# Patient Record
Sex: Male | Born: 1960 | Race: White | Hispanic: No | Marital: Married | Smoking: Never smoker
Health system: Southern US, Community
[De-identification: ages and names within clinical notes are randomized; demographics above are authoritative.]

## PROBLEM LIST (undated history)

## (undated) DIAGNOSIS — Z87442 Personal history of urinary calculi: Secondary | ICD-10-CM

## (undated) DIAGNOSIS — H3520 Other non-diabetic proliferative retinopathy, unspecified eye: Secondary | ICD-10-CM

## (undated) DIAGNOSIS — I739 Peripheral vascular disease, unspecified: Secondary | ICD-10-CM

## (undated) DIAGNOSIS — N529 Male erectile dysfunction, unspecified: Secondary | ICD-10-CM

## (undated) DIAGNOSIS — C61 Malignant neoplasm of prostate: Secondary | ICD-10-CM

## (undated) DIAGNOSIS — I639 Cerebral infarction, unspecified: Secondary | ICD-10-CM

## (undated) DIAGNOSIS — G4730 Sleep apnea, unspecified: Secondary | ICD-10-CM

## (undated) DIAGNOSIS — I10 Essential (primary) hypertension: Secondary | ICD-10-CM

## (undated) DIAGNOSIS — I219 Acute myocardial infarction, unspecified: Secondary | ICD-10-CM

## (undated) DIAGNOSIS — Z86010 Personal history of colonic polyps: Secondary | ICD-10-CM

## (undated) DIAGNOSIS — L97509 Non-pressure chronic ulcer of other part of unspecified foot with unspecified severity: Secondary | ICD-10-CM

## (undated) DIAGNOSIS — E119 Type 2 diabetes mellitus without complications: Secondary | ICD-10-CM

## (undated) DIAGNOSIS — J189 Pneumonia, unspecified organism: Secondary | ICD-10-CM

## (undated) DIAGNOSIS — E785 Hyperlipidemia, unspecified: Secondary | ICD-10-CM

## (undated) DIAGNOSIS — I2510 Atherosclerotic heart disease of native coronary artery without angina pectoris: Secondary | ICD-10-CM

## (undated) DIAGNOSIS — H409 Unspecified glaucoma: Secondary | ICD-10-CM

## (undated) HISTORY — DX: Hyperlipidemia, unspecified: E78.5

## (undated) HISTORY — DX: Cerebral infarction, unspecified: I63.9

## (undated) HISTORY — PX: REFRACTIVE SURGERY: SHX103

## (undated) HISTORY — DX: Unspecified glaucoma: H40.9

## (undated) HISTORY — DX: Essential (primary) hypertension: I10

## (undated) HISTORY — PX: BRONCHOSCOPY: SUR163

## (undated) HISTORY — PX: EYE SURGERY: SHX253

## (undated) HISTORY — PX: FEMORAL-POPLITEAL BYPASS GRAFT: SHX937

## (undated) HISTORY — DX: Sleep apnea, unspecified: G47.30

## (undated) HISTORY — DX: Type 2 diabetes mellitus without complications: E11.9

---

## 2006-06-17 ENCOUNTER — Emergency Department: Payer: Self-pay | Admitting: Emergency Medicine

## 2006-06-19 ENCOUNTER — Inpatient Hospital Stay: Payer: Self-pay | Admitting: Internal Medicine

## 2006-06-22 ENCOUNTER — Other Ambulatory Visit: Payer: Self-pay

## 2007-11-02 IMAGING — US US RENAL KIDNEY
1 series · 17 of 20 positions shown · non-contrast
Comparison: none

REASON FOR EXAM: kidney stones
COMMENTS:

[Series 1: us renal kidney · 17 of 20 slices shown]
[im 1/20]
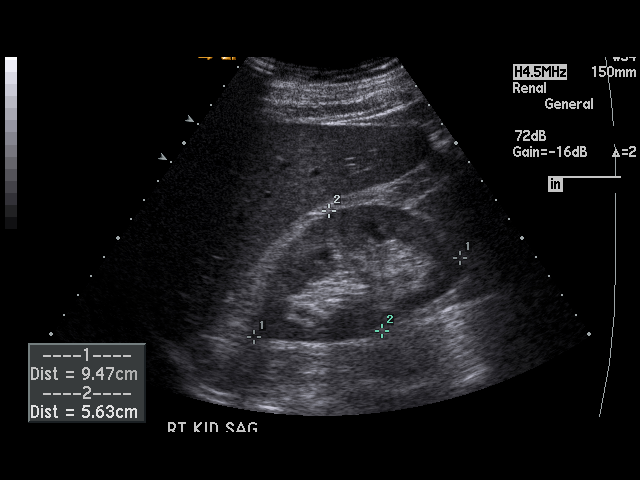
[im 2/20]
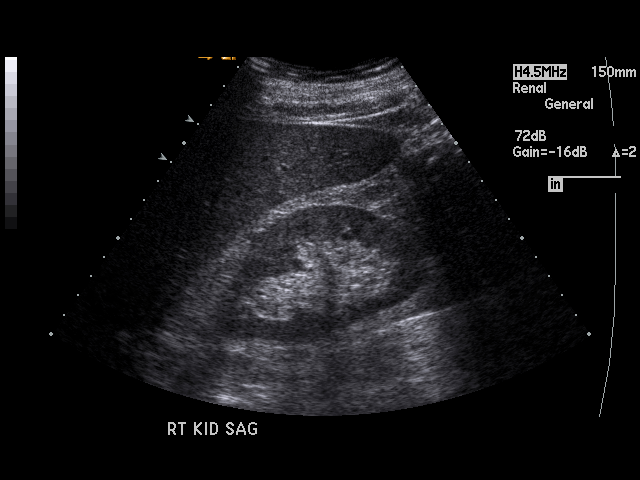
[im 3/20]
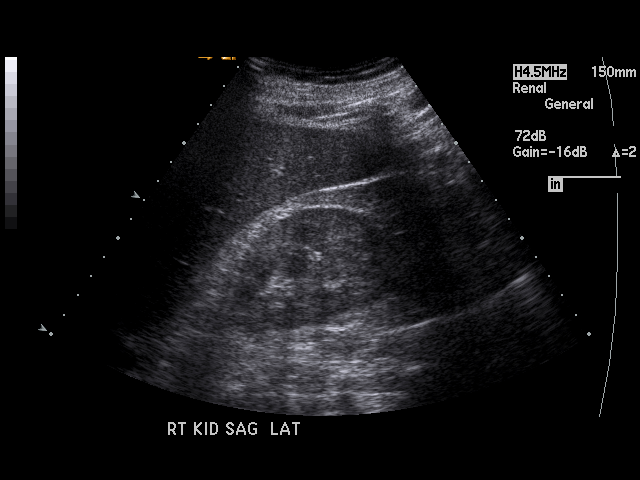
[im 5/20]
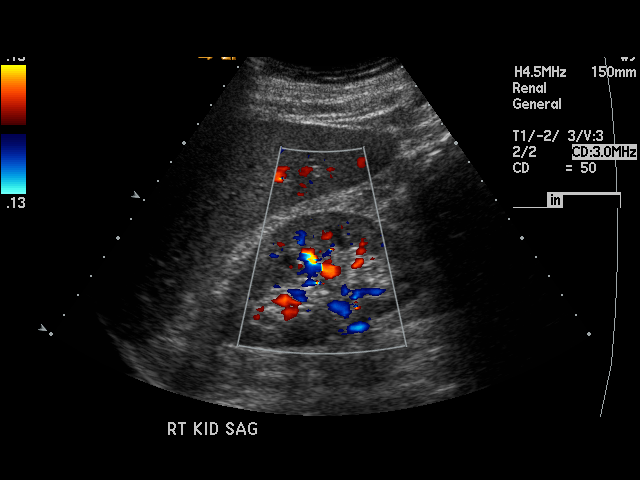
[im 6/20]
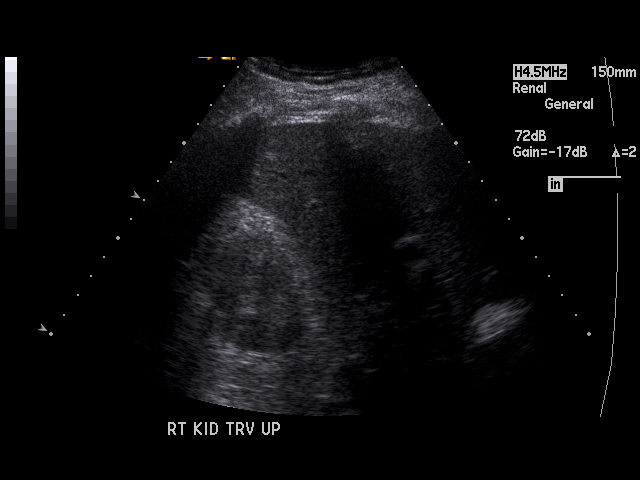
[im 7/20]
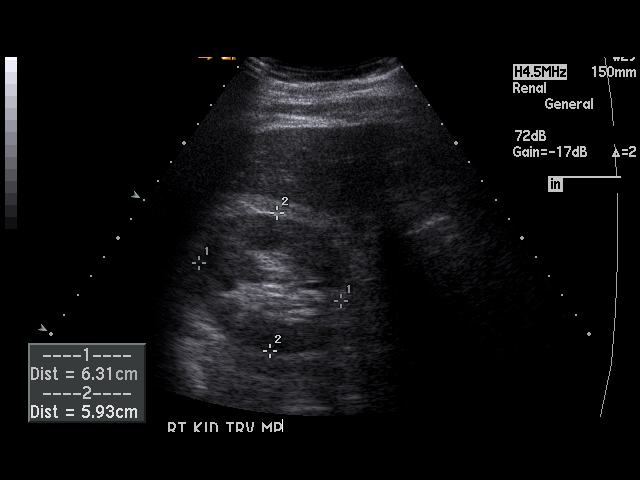
[im 8/20]
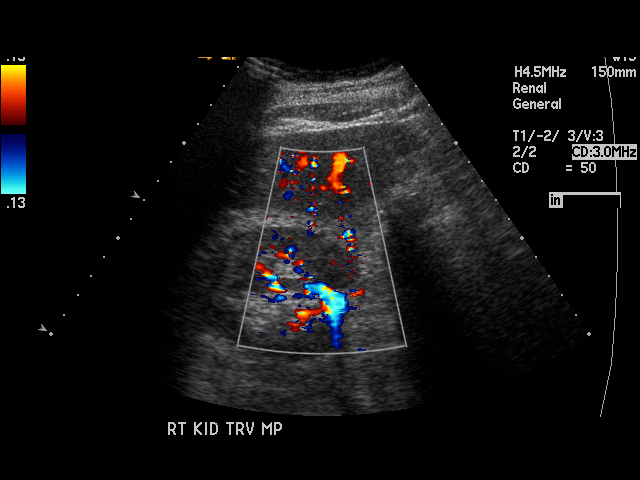
[im 9/20]
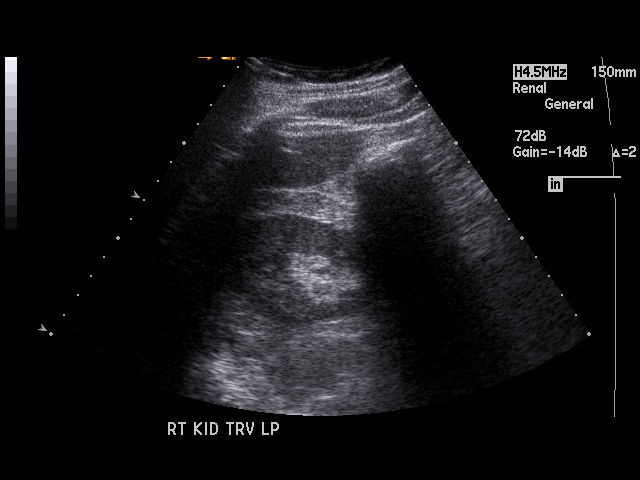
[im 11/20]
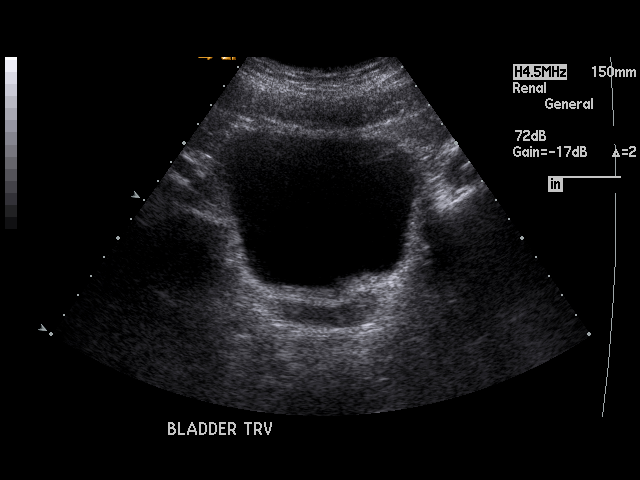
[im 12/20]
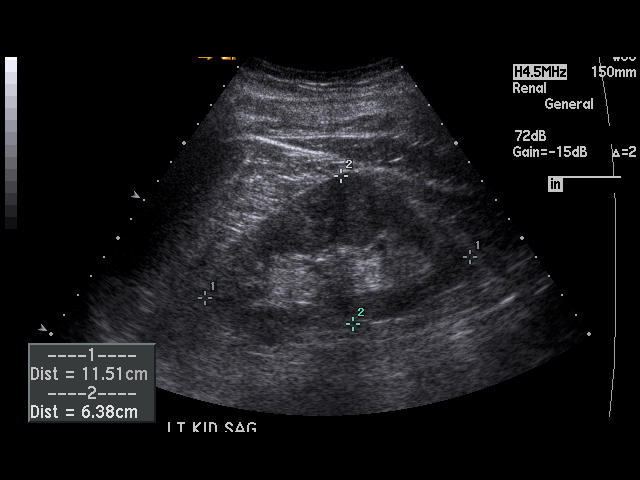
[im 13/20]
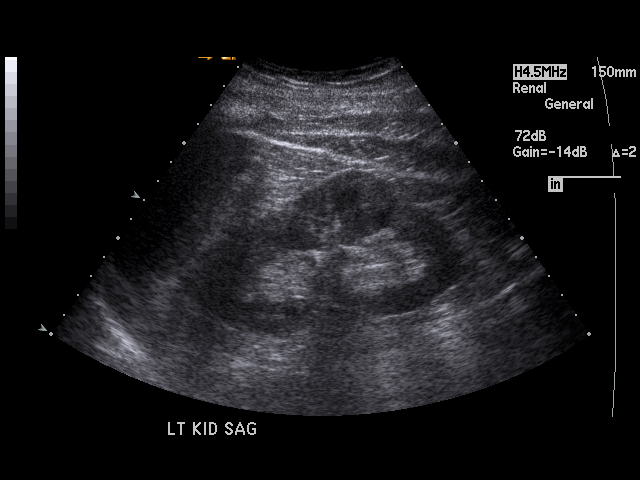
[im 14/20]
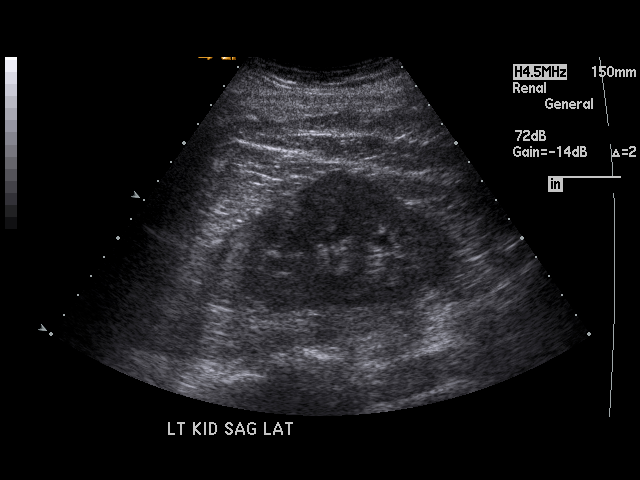
[im 15/20]
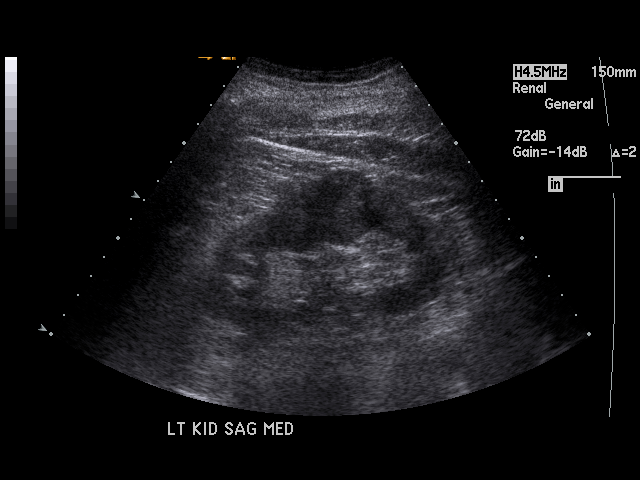
[im 16/20]
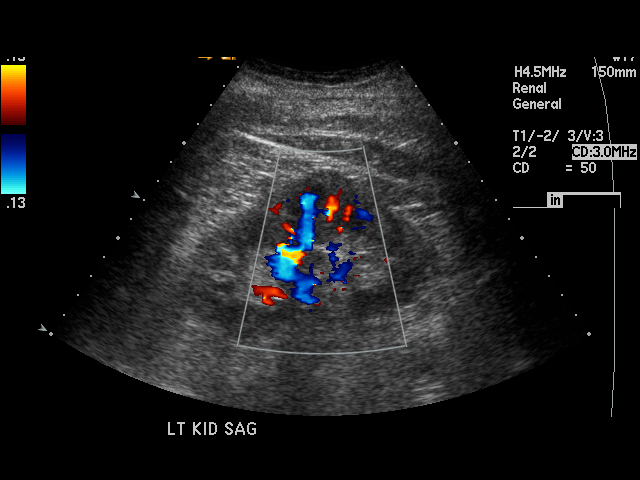
[im 18/20]
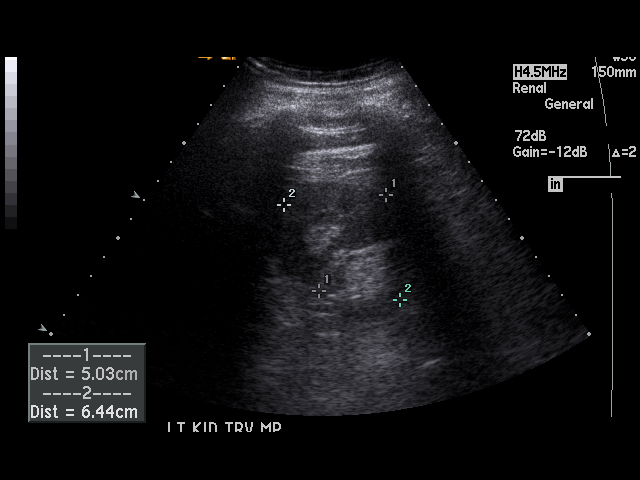
[im 19/20]
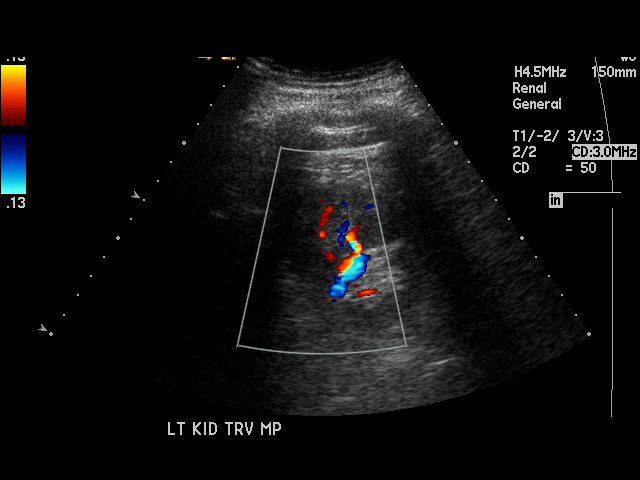
[im 20/20]
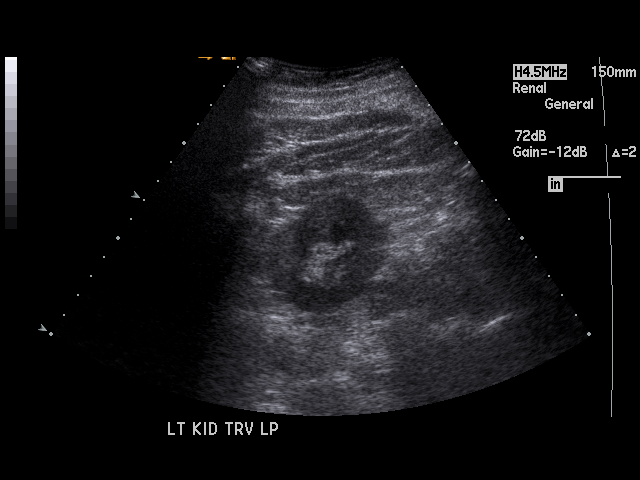

[17 of 20 positions shown; findings below may reference images not displayed]

PROCEDURE:     US  - US KIDNEY BILATERAL  - June 23, 2006  [DATE]

RESULT:     Size, shape and echotexture of both kidneys is normal. The RIGHT
kidney measures 9.5 cm in maximum length and the LEFT kidney measures 11.5.
Renal echotexture is normal. There is no hydronephrosis.  Slight discrepancy
in the renal size is most likely secondary to slight malrotation. A small
dromedary hump is noted in the LEFT kidney.
IMPRESSION: 1)Normal exam.

## 2010-11-13 ENCOUNTER — Encounter: Payer: Self-pay | Admitting: Physical Medicine and Rehabilitation

## 2010-11-27 ENCOUNTER — Encounter: Payer: Self-pay | Admitting: Physical Medicine and Rehabilitation

## 2010-12-28 ENCOUNTER — Encounter: Payer: Self-pay | Admitting: Physical Medicine and Rehabilitation

## 2011-01-27 ENCOUNTER — Encounter: Payer: Self-pay | Admitting: Physical Medicine and Rehabilitation

## 2011-02-27 ENCOUNTER — Encounter: Payer: Self-pay | Admitting: Physical Medicine and Rehabilitation

## 2011-03-30 ENCOUNTER — Encounter: Payer: Self-pay | Admitting: Physical Medicine and Rehabilitation

## 2011-04-29 ENCOUNTER — Encounter: Payer: Self-pay | Admitting: Physical Medicine and Rehabilitation

## 2011-05-30 ENCOUNTER — Encounter: Payer: Self-pay | Admitting: Physical Medicine and Rehabilitation

## 2011-06-29 ENCOUNTER — Encounter: Payer: Self-pay | Admitting: Physical Medicine and Rehabilitation

## 2011-07-30 ENCOUNTER — Encounter: Payer: Self-pay | Admitting: Physical Medicine and Rehabilitation

## 2011-08-30 ENCOUNTER — Encounter: Payer: Self-pay | Admitting: Physical Medicine and Rehabilitation

## 2011-09-27 ENCOUNTER — Encounter: Payer: Self-pay | Admitting: Physical Medicine and Rehabilitation

## 2011-10-28 ENCOUNTER — Encounter: Payer: Self-pay | Admitting: Physical Medicine and Rehabilitation

## 2011-11-14 ENCOUNTER — Ambulatory Visit: Payer: Self-pay | Admitting: Vascular Surgery

## 2011-11-14 LAB — BASIC METABOLIC PANEL
Anion Gap: 8 (ref 7–16)
Calcium, Total: 9.2 mg/dL (ref 8.5–10.1)
Creatinine: 1.17 mg/dL (ref 0.60–1.30)
EGFR (African American): >60
EGFR (Non-African Amer.): >60
Glucose: 192 mg/dL — ABNORMAL HIGH (ref 65–99)
Osmolality: 289 (ref 275–301)
Potassium: 4.4 mmol/L (ref 3.5–5.1)
Sodium: 140 mmol/L (ref 136–145)

## 2011-11-27 ENCOUNTER — Encounter: Payer: Self-pay | Admitting: Physical Medicine and Rehabilitation

## 2012-01-22 DIAGNOSIS — I739 Peripheral vascular disease, unspecified: Secondary | ICD-10-CM | POA: Insufficient documentation

## 2012-05-29 DIAGNOSIS — N200 Calculus of kidney: Secondary | ICD-10-CM | POA: Insufficient documentation

## 2012-12-08 DIAGNOSIS — L97509 Non-pressure chronic ulcer of other part of unspecified foot with unspecified severity: Secondary | ICD-10-CM | POA: Insufficient documentation

## 2013-03-26 DIAGNOSIS — G4730 Sleep apnea, unspecified: Secondary | ICD-10-CM | POA: Insufficient documentation

## 2014-08-12 ENCOUNTER — Ambulatory Visit: Payer: Self-pay | Admitting: Gastroenterology

## 2014-11-20 NOTE — Op Note (Signed)
PATIENT NAME:  Tyler Farley, Tyler Farley MR#:  [id number] DATE OF BIRTH:  October 22, 1960  DATE OF PROCEDURE:  11/14/2011  PREOPERATIVE DIAGNOSES:  1. Peripheral arterial disease with ulceration, right lower extremity.  2. Long-standing diabetes mellitus.  3. Stroke.   POSTOPERATIVE DIAGNOSES:  1. Peripheral arterial disease with ulceration, right lower extremity.  2. Long-standing diabetes mellitus.  3. Stroke.   PROCEDURES:  1. Ultrasound guidance for vascular access, left femoral artery.  2. Catheter placement into right peroneal artery from left femoral approach.  3. Aortogram and selective right lower extremity angiogram.  4. Percutaneous transluminal angioplasty of right tibioperoneal trunk and proximal peroneal artery with 3 mm diameter angioplasty balloon.  5. StarClose closure device left femoral artery.   SURGEON: Algernon Huxley, M.D.   ANESTHESIA: Local with moderate conscious sedation.   ESTIMATED BLOOD LOSS: Minimal.   INDICATION FOR PROCEDURE: This is a 54 year old white male with nonhealing ulceration of the right lower extremity and clinical exam with no palpable pedal pulses. He was seen by podiatry who recommended he have an arterial evaluation. He is brought in for angiogram for further evaluation of his disease. The risks and benefits were discussed and informed consent was obtained.   DESCRIPTION OF PROCEDURE: The patient was brought to the vascular interventional radiology suite. His groins were shaved and prepped and a sterile surgical field was created. The left femoral head was localized with fluoroscopy. Ultrasound was used to access a patent left femoral artery without difficulty with a Seldinger needle and a permanent image was recorded. J-wire and 5 French sheath were placed. A pigtail catheter was placed in the aorta at the L1 level and AP aortogram was performed. This showed no flow-limiting stenosis in the aortoiliac segments and patent renal artery without significant  stenosis bilaterally. I hooked the aortic bifurcation and advanced to the right femoral head and selective right lower extremity angiogram was then performed. This showed a diffusely calcific superficial femoral artery without stenosis. The profunda femoris artery and common femoral artery were patent without stenosis. The popliteal artery had some very mild stenoses of less than 20%, but at the level of the tibial trifurcation there was severe disease. The anterior tibial artery and posterior tibial arteries were chronically occluded early on in their course. The tibioperoneal trunk and proximal peroneal artery had an approximately 70 to 80% stenosis, in the early portion of the peroneal artery and the tibioperoneal trunk, and then the peroneal artery was the dominant runoff distally. He did appear to reconstitute in the anterior tibial artery, at the level of the ankle. The patient was given 3000 units of intravenous heparin for systemic anticoagulation. A 6 French Ansel sheath was placed over a Terumo Advantage wire, and I then cross this lesion with a V18 wire and the help of a Kumpe catheter. I confirmed intraluminal flow in the peroneal artery distal to the stenosis with a Kumpe catheter and then replaced the wire. A 3 mm diameter angioplasty balloon was inflated in the tibioperoneal trunk and proximal peroneal artery in an area of stenosis. A tight waste was taken which resolved and completion angiogram following angioplasty showed this area now to be widely patent without significant residual stenosis. At this point, I elected to terminate the procedure. The sheath was pulled back to the ipsilateral external iliac artery and oblique arteriogram was performed. StarClose closure device was deployed in the usual fashion with excellent hemostatic result. The patient tolerated the procedure well and was taken to the recovery room  in stable condition.  ____________________________ Algernon Huxley,  MD jsd:slb D: 11/14/2011 11:35:06 ET T: 11/14/2011 14:33:00 ET JOB#: [id number]  cc: Algernon Huxley, MD, <Dictator> Gerrit Heck Troxler, DPM Clarita Leber, MD Algernon Huxley MD ELECTRONICALLY SIGNED 11/18/2011 9:55

## 2014-11-21 LAB — SURGICAL PATHOLOGY

## 2016-03-11 ENCOUNTER — Ambulatory Visit (INDEPENDENT_AMBULATORY_CARE_PROVIDER_SITE_OTHER): Payer: Medicare PPO | Admitting: Urology

## 2016-03-11 ENCOUNTER — Encounter: Payer: Self-pay | Admitting: Urology

## 2016-03-11 DIAGNOSIS — E113599 Type 2 diabetes mellitus with proliferative diabetic retinopathy without macular edema, unspecified eye: Secondary | ICD-10-CM | POA: Insufficient documentation

## 2016-03-11 DIAGNOSIS — E109 Type 1 diabetes mellitus without complications: Secondary | ICD-10-CM | POA: Insufficient documentation

## 2016-03-11 DIAGNOSIS — I639 Cerebral infarction, unspecified: Secondary | ICD-10-CM | POA: Insufficient documentation

## 2016-03-11 DIAGNOSIS — I10 Essential (primary) hypertension: Secondary | ICD-10-CM | POA: Insufficient documentation

## 2016-03-11 DIAGNOSIS — E785 Hyperlipidemia, unspecified: Secondary | ICD-10-CM | POA: Insufficient documentation

## 2016-03-11 DIAGNOSIS — R945 Abnormal results of liver function studies: Secondary | ICD-10-CM | POA: Insufficient documentation

## 2016-03-11 DIAGNOSIS — R809 Proteinuria, unspecified: Secondary | ICD-10-CM

## 2016-03-11 DIAGNOSIS — R972 Elevated prostate specific antigen [PSA]: Secondary | ICD-10-CM | POA: Diagnosis not present

## 2016-03-11 DIAGNOSIS — R7989 Other specified abnormal findings of blood chemistry: Secondary | ICD-10-CM | POA: Insufficient documentation

## 2016-03-11 DIAGNOSIS — E1029 Type 1 diabetes mellitus with other diabetic kidney complication: Secondary | ICD-10-CM | POA: Insufficient documentation

## 2016-03-11 DIAGNOSIS — E11649 Type 2 diabetes mellitus with hypoglycemia without coma: Secondary | ICD-10-CM | POA: Insufficient documentation

## 2016-03-11 NOTE — Progress Notes (Signed)
03/11/2016 9:13 AM   Tyler Farley 07/01/1961 YS:[id number]  Referring provider: [name], MD [place], [place] [address]  CC: Elevated PSA  HPI:  1 - Elevated PSA - long h/o up and down PSA. Uncle with prostate cancer in his 3se as significant CV comorbidity and is disabled. Recent summarized PSA history: 04/2014 - 5.02 07/2014 - 10.5 --> recheck 5.1 01/2016 - 7.8 / DRE 65gm smooth  PMH sig for PAD (bilat fem-pop, chornic LE ulcers, plavix use), IDDM2, CVA (left side hemiparesis, uses cane). His PCP is Emily Filbert MD with [facility].   Today " Tyler Farley " is seen as new patient for above.    PMH: No past medical history on file.  Surgical History: No past surgical history on file.  Home Medications:    Medication List    as of 03/11/2016  9:13 AM   You have not been prescribed any medications.     Allergies: Allergies not on file  Family History: No family history on file.  Social History:  has no tobacco, alcohol, and drug history on file.     Review of Systems  Gastrointestinal (upper)  : Negative for upper GI symptoms  Gastrointestinal (lower) : Negative for lower GI symptoms  Constitutional : Negative for symptoms  Skin: chronic LE and toe ulcers  Eyes: Negative for eye symptoms  Ear/Nose/Throat : Negative for Ear/Nose/Throat symptoms  Hematologic/Lymphatic: Negative for Hematologic/Lymphatic symptoms  Cardiovascular : Negative for cardiovascular symptoms  Respiratory : Negative for respiratory symptoms  Endocrine: Negative for endocrine symptoms  Musculoskeletal: Joint pain  Neurological: Negative for neurological symptoms  Psychologic: Negative for psychiatric symptoms     Physical Exam: There were no vitals taken for this visit.  Constitutional:  Alert and oriented, No acute distress. HEENT: [place] AT, moist mucus membranes.  Trachea midline, no masses. Cardiovascular:  No clubbing, cyanosis, or edema. Respiratory: Normal respiratory effort, no increased work of breathing. GI: Abdomen is soft, nontender, nondistended, no abdominal masses GU: No CVA tenderness.  DRE 65gm smooth. Skin: No rashes, bruises or suspicious lesions. Lt > Rt LE edma.  Lymph: No cervical or inguinal adenopathy. Neurologic: Stigmata of CVA with left sided partial paresis. Uses cane. Psychiatric: Normal mood and affect.  Laboratory Data: No results found for: WBC, HGB, HCT, MCV, PLT  Lab Results  Component Value Date   CREATININE 1.17 11/14/2011    No results found for: PSA  No results found for: TESTOSTERONE  No results found for: HGBA1C  Urinalysis No results found for: COLORURINE, APPEARANCEUR, LABSPEC, PHURINE, GLUCOSEU, HGBUR, BILIRUBINUR, KETONESUR, PROTEINUR, UROBILINOGEN, NITRITE, LEUKOCYTESUR  Pertinent Imaging: none  Assessment & Plan:    1. Elevated PSA - up and down course suggests at least some inflammatory component. He has signifincat CV comorbdity and frankly may not have 10 year life expectancy. Natural history of prostate cancer discussed (usually very slow growing). Discussed options of:  A - least aggressive - no further screening. His level of comorbidity supports this  B - moderate - recheck PSA 68mos with free / total and 3 days abstinance prior, consider further eval if truly appears to be trending up. I think this is most reasonable.   C - most aggressive - proceed with biopsy. He would have to come off plavix prior. Even if cancer present would favor surveillance unless very large volume / agresive.  He opts for recheck in 4 mos. I agree completely. If returns to his  prior baseline near 5 I feel yearly surveillance alone most appropriate.   Alexis Frock, [facility] [address] [place], [place] [address] [phone]

## 2016-07-02 ENCOUNTER — Other Ambulatory Visit: Payer: Medicare PPO

## 2016-07-02 DIAGNOSIS — R972 Elevated prostate specific antigen [PSA]: Secondary | ICD-10-CM

## 2016-07-03 LAB — PSA, TOTAL AND FREE
PSA FREE PCT: 18.7 %
PSA FREE: 1.42 ng/mL
Prostate Specific Ag, Serum: 7.6 ng/mL — ABNORMAL HIGH (ref 0.0–4.0)

## 2016-07-05 ENCOUNTER — Other Ambulatory Visit: Payer: Medicare PPO

## 2016-07-10 ENCOUNTER — Telehealth: Payer: Self-pay

## 2016-07-10 NOTE — Telephone Encounter (Signed)
-----   Message from Alexis Frock, MD sent at 07/09/2016  1:21 PM EST ----- Regarding: PSA elevatoin PSA recheck in 7's, this is improved but still above prior baseline of around 5.  Options would be A - office visit to rediscuss  Or   B - proceed with biopsy. If he wants that, will need Bactrim BID x 6 days to start 3 days prior.  Thanks, T [place]

## 2016-07-10 NOTE — Telephone Encounter (Signed)
The pt would like to f/u in 3 mths with a PSA prior. Is 47mths okay? Please advise

## 2016-07-10 NOTE — Telephone Encounter (Signed)
Attempted to contact pt, no answer. LMOM to return my call. 

## 2016-07-12 ENCOUNTER — Ambulatory Visit: Payer: Medicare PPO

## 2016-07-16 NOTE — Telephone Encounter (Signed)
           The pt would like to f/u 3 mths w/ a PSA prior. Is 3 mth okay? Please advise

## 2016-07-16 NOTE — Telephone Encounter (Signed)
-----   Message from Alexis Frock, MD sent at 07/09/2016  1:21 PM EST ----- Regarding: PSA elevatoin PSA recheck in 7's, this is improved but still above prior baseline of around 5.  Options would be A - office visit to rediscuss  Or   B - proceed with biopsy. If he wants that, will need Bactrim BID x 6 days to start 3 days prior.  Thanks, T [place]

## 2016-07-29 DIAGNOSIS — C61 Malignant neoplasm of prostate: Secondary | ICD-10-CM

## 2016-07-29 DIAGNOSIS — I219 Acute myocardial infarction, unspecified: Secondary | ICD-10-CM

## 2016-07-29 DIAGNOSIS — I639 Cerebral infarction, unspecified: Secondary | ICD-10-CM

## 2016-07-29 HISTORY — DX: Cerebral infarction, unspecified: I63.9

## 2016-07-29 HISTORY — DX: Acute myocardial infarction, unspecified: I21.9

## 2016-07-29 HISTORY — PX: CARDIAC CATHETERIZATION: SHX172

## 2016-07-29 HISTORY — DX: Malignant neoplasm of prostate: C61

## 2016-10-03 ENCOUNTER — Other Ambulatory Visit: Payer: Self-pay

## 2016-10-03 DIAGNOSIS — R972 Elevated prostate specific antigen [PSA]: Secondary | ICD-10-CM

## 2016-10-04 ENCOUNTER — Other Ambulatory Visit: Payer: Medicare PPO

## 2016-10-04 DIAGNOSIS — R972 Elevated prostate specific antigen [PSA]: Secondary | ICD-10-CM

## 2016-10-05 LAB — PSA: Prostate Specific Ag, Serum: 10 ng/mL — ABNORMAL HIGH (ref 0.0–4.0)

## 2016-10-09 ENCOUNTER — Telehealth: Payer: Self-pay

## 2016-10-09 NOTE — Telephone Encounter (Signed)
Pt called and LMOM that he wanted lab results. Pt has an appt on Friday. Nurse called pt back and LMOM that he will need to wait until Friday at appt to receive lab results.

## 2016-10-11 ENCOUNTER — Ambulatory Visit: Payer: Medicare PPO | Admitting: Urology

## 2016-10-28 ENCOUNTER — Ambulatory Visit: Payer: Medicare PPO

## 2016-12-10 HISTORY — PX: PROSTATE BIOPSY: SHX241

## 2016-12-30 DIAGNOSIS — I2510 Atherosclerotic heart disease of native coronary artery without angina pectoris: Secondary | ICD-10-CM | POA: Insufficient documentation

## 2017-01-08 ENCOUNTER — Encounter: Payer: Self-pay | Admitting: Radiation Oncology

## 2017-01-27 ENCOUNTER — Encounter: Payer: Self-pay | Admitting: Radiation Oncology

## 2017-01-27 NOTE — Progress Notes (Signed)
GU Location of Tumor / Histology: prostatic adenocarcinoma   If Prostate Cancer, Gleason Score is (4 + 3) and PSA is (10). Prostate volume: 33 cc.    Biopsies of prostate (if applicable) revealed:    Past/Anticipated interventions by urology, if any: Prostate biopsy. No evidence of extraprostatic spread grossly or adenopathy. Referral to Dr. Tammi Klippel for consideration of seeds or IMRT. May require Lupron.   Past/Anticipated interventions by medical oncology, if any: no  Weight changes, if any: -7-10 lb in the last month since starting a low carb diet.  Bowel/Bladder complaints, if any: Reports nocturia x 1-2. Reports hesitancy. Describes a steady urine stream without difficulty emptying his bladder. Denies dysuria, hematuria, incontinence, or leakage.    Nausea/Vomiting, if any: 30 minutes following prostate biopsy but, none since  Pain issues, if any:  Sore all over due to "cholesterol medication (lipitor)"  SAFETY ISSUES:  Prior radiation? no  Pacemaker/ICD? no  Possible current pregnancy? no  Is the patient on methotrexate? no  Current Complaints / other details:  56 year old male. Married with two daughters and one son. Recently, patient had a drug eluding stent placed. He is going to be on Plavix and aspirin for six months. Patient reports he has been taking Plavix for the last five years. Reports he stopped Plavix for one week in preparation for a medical procedure and very shortly thereafter suffered a heart attack (5 weeks ago).

## 2017-01-30 ENCOUNTER — Encounter: Payer: Self-pay | Admitting: Medical Oncology

## 2017-01-30 ENCOUNTER — Ambulatory Visit
Admission: RE | Admit: 2017-01-30 | Discharge: 2017-01-30 | Disposition: A | Discharge status: Home / Self Care | Payer: Medicare PPO | Source: Ambulatory Visit | Attending: Radiation Oncology | Admitting: Radiation Oncology

## 2017-01-30 ENCOUNTER — Encounter: Payer: Self-pay | Admitting: Radiation Oncology

## 2017-01-30 VITALS — BP 172/78 | HR 75 | Resp 16 | Ht 66.0 in | Wt 153.0 lb

## 2017-01-30 DIAGNOSIS — C61 Malignant neoplasm of prostate: Secondary | ICD-10-CM | POA: Insufficient documentation

## 2017-01-30 HISTORY — DX: Malignant neoplasm of prostate: C61

## 2017-01-30 NOTE — Progress Notes (Signed)
See progress note under physician encounter. 

## 2017-01-30 NOTE — Progress Notes (Signed)
Radiation Oncology         [phone] ________________________________  Initial outpatient Consultation  Name: Tyler Farley MRN: [id number]  Date: 01/30/2017  DOB: 05-07-1961  YS:[id number], [name], MD  Kathie Rhodes, MD   REFERRING PHYSICIAN: Kathie Rhodes, MD  DIAGNOSIS:   56 y.o. gentleman with Stage T1c adenocarcinoma of the prostate with Gleason Score of 4+3, and PSA of 10.0    ICD-10-CM   1. Malignant neoplasm of prostate ([place]) C61     HISTORY OF PRESENT ILLNESS: Tyler Farley is a 56 y.o. male with a diagnosis of prostate cancer. He was noted to have an elevated PSA of 10 in March 2018 by his primary care physician, Dr. Sabra Heck.  Accordingly, he was referred for evaluation in urology by Dr. Karsten Ro and digital rectal examination was performed at that time revealing no abnormalities.  The patient proceeded to transrectal ultrasound with 12 biopsies of the prostate on 12/10/16.  The prostate volume measured 32 cc.  Out of 12 core biopsies, 2 were positive.  The maximum Gleason score was 4+3=7 and this was seen in the left apex lateral and the left apex.  CT scan of the pelvis 12/27/16 showed no adenopathy or other findings of the metastatic diease in the pelvis.  The patient saw Dr. Karsten Ro on 01/07/17. The patient had a coronary stent placed acute MI recently and he will be on Plavix and Asprin.  The patient reviewed the biopsy results with his urologist and he has kindly been referred today for discussion of potential radiation treatment options.  PSA 3/18: 10.0 12/17: 7.6 10/17: 9.24 7/17: 7.78 3/17: 6.70 1/16: 5.15 (Obtained one week after the previous PSA) 1/16: 10.5 (Obtained right after a colonoscopy)   PREVIOUS RADIATION THERAPY: No  PAST MEDICAL HISTORY:  Past Medical History:  Diagnosis Date  . Diabetes ([place])   . Glaucoma   . Hyperlipemia   . Hypertension   . Prostate cancer ([place])   . Sleep apnea   . Stroke [facility])       PAST SURGICAL HISTORY: Past Surgical  History:  Procedure Laterality Date  . FEMORAL-POPLITEAL BYPASS GRAFT Bilateral 2013, 2014  . PROSTATE BIOPSY  12/10/2016    FAMILY HISTORY:  Family History  Problem Relation Age of Onset  . Prostate cancer Maternal Uncle   . Kidney cancer Neg Hx   . Bladder Cancer Neg Hx     SOCIAL HISTORY:  Social History   Social History  . Marital status: Married    Spouse name: N/A  . Number of children: N/A  . Years of education: N/A   Occupational History  . Not on file.   Social History Main Topics  . Smoking status: Never Smoker  . Smokeless tobacco: Never Used  . Alcohol use No  . Drug use: No  . Sexual activity: No   Other Topics Concern  . Not on file   Social History Narrative  . No narrative on file    ALLERGIES: Atorvastatin  MEDICATIONS:  Current Outpatient Prescriptions  Medication Sig Dispense Refill  . Alpha-Lipoic Acid 50 MG CAPS Take by mouth.    Marland Kitchen aspirin 81 MG tablet Take by mouth.    Marland Kitchen atorvastatin (LIPITOR) 20 MG tablet     . B Complex Vitamins (VITAMIN-B COMPLEX) TABS Take by mouth.    . carvedilol (COREG) 12.5 MG tablet     . clopidogrel (PLAVIX) 75 MG tablet Take 75 mg by mouth daily.    Marland Kitchen co-enzyme  Q-10 30 MG capsule     . ezetimibe (ZETIA) 10 MG tablet Take 10 mg by mouth daily.    . hydrochlorothiazide (HYDRODIURIL) 12.5 MG tablet     . LANTUS SOLOSTAR 100 UNIT/ML Solostar Pen     . losartan (COZAAR) 100 MG tablet Take by mouth.    Marland Kitchen NOVOLOG FLEXPEN 100 UNIT/ML FlexPen     . baclofen (LIORESAL) 10 MG tablet      No current facility-administered medications for this encounter.     REVIEW OF SYSTEMS:  On review of systems, the patient reports that he is doing well overall. He denies any chest pain, shortness of breath, cough, fevers, chills, night sweats. He reports losing 7-10lbs in the past month since starting a low carb diet. He denies any bowel disturbances, and denies abdominal pain. He reported nausea/vomiting 30 minutes following his  prostate biopsy, but none since. He reports being sore all over that he attributes to Lipitor.  any new musculoskeletal or joint aches or pains. His IPSS was 10, indicating moderate urinary symptoms. He reports nocturia x1-2, hesitancy, and describes a steady urine stream without difficulty emptying his bladder. He denies dysuria, hematuria,or incontinence. He is unable to complete sexual activity. A complete review of systems is obtained and is otherwise negative.    PHYSICAL EXAM:  Wt Readings from Last 3 Encounters:  01/30/17 153 lb (69.4 kg)  03/11/16 158 lb (71.7 kg)   Temp Readings from Last 3 Encounters:  No data found for Temp   BP Readings from Last 3 Encounters:  01/30/17 (!) 172/78  03/11/16 (!) 190/79   Pulse Readings from Last 3 Encounters:  01/30/17 75  03/11/16 90   Pain Assessment Pain Score: 0-No pain/10  In general this is a well appearing Caucasian male in no acute distress. He is alert and oriented x4 and appropriate throughout the examination.  Please note that on DRE described above, there were no prostate nodules.   KPS = 100  100 - Normal; no complaints; no evidence of disease. 90   - Able to carry on normal activity; minor signs or symptoms of disease. 80   - Normal activity with effort; some signs or symptoms of disease. 30   - Cares for self; unable to carry on normal activity or to do active work. 60   - Requires occasional assistance, but is able to care for most of his personal needs. 50   - Requires considerable assistance and frequent medical care. 63   - Disabled; requires special care and assistance. 54   - Severely disabled; hospital admission is indicated although death not imminent. 60   - Very sick; hospital admission necessary; active supportive treatment necessary. 10   - Moribund; fatal processes progressing rapidly. 0     - Dead  Karnofsky DA, Abelmann Ames, Craver LS and [facility] [phone]) The use of the nitrogen mustards in the  palliative treatment of carcinoma: with particular reference to bronchogenic carcinoma Cancer 1 [id number]  LABORATORY DATA:  No results found for: WBC, HGB, HCT, MCV, PLT Lab Results  Component Value Date   NA 140 11/14/2011   K 4.4 11/14/2011   CL 104 11/14/2011   CO2 28 11/14/2011   No results found for: ALT, AST, GGT, ALKPHOS, BILITOT   RADIOGRAPHY: No results found.    IMPRESSION/PLAN: 1. 56 y.o. gentleman with Stage T1c adenocarcinoma of the prostate with Gleason Score of 4+3, and PSA of 10.0  Today I reviewed the findings and  workup thus far.  We discussed the natural history of prostate cancer.  We reviewed the the implications of T-stage, Gleason's Score, and PSA on decision-making and outcomes in prostate cancer.  We discussed radiation treatment in the management of prostate cancer with regard to the logistics and delivery of external beam radiation treatment as well as the logistics and delivery of prostate brachytherapy.  We compared and contrasted each of these approaches and also compared these against prostatectomy.  The patient expressed interest in prostate brachytherapy.  I filled out a patient counseling form for him with relevant treatment diagrams and we retained a copy for our records.   The patient would like to proceed with prostate brachytherapy.  I will share my findings with Dr. Karsten Ro and move forward with scheduling the procedure in the near future.     The patient will stop his Plavix in the perioperative period at the direction of his cardiologist.  I enjoyed meeting with him today, and will look forward to participating in the care of this very nice gentleman.   ------------------------------------------------   Tyler Pita, MD [place] Director and Director of Stereotactic Radiosurgery Direct Dial: [phone]  Fax: [phone] [email or web address]  Skype  LinkedIn  This document serves as a record of services  personally performed by Tyler Pita MD. It was created on his behalf by Delton Coombes, a trained medical scribe. The creation of this record is based on the scribe's personal observations and the provider's statements to them. This document has been checked and approved by the attending provider.

## 2017-01-30 NOTE — Progress Notes (Signed)
Introduced myself to Mr. Reczek- Lanny Hurst and his wife Manuela Schwartz as the prostate oncology nurse navigator and my role. He states that he has quite a few medical issues and was surprised to be diagnosed with prostate cancer. He is not sure what treatment option will be best for him due to being on plavix due to a stroke. I will continue to follow and asked him to call me with any questions or concerns.

## 2017-02-03 ENCOUNTER — Telehealth: Payer: Self-pay | Admitting: *Deleted

## 2017-02-03 NOTE — Telephone Encounter (Signed)
Called patient to update patient about implant, lvm for a return call

## 2017-02-06 ENCOUNTER — Telehealth: Payer: Self-pay | Admitting: Radiation Oncology

## 2017-02-06 NOTE — Telephone Encounter (Signed)
Received voicemail message from patient requesting return call. Phoned patient at number provided. No answer. Left message with direct line encouraging patient to phone back with needs. Awaiting return call.

## 2017-02-07 ENCOUNTER — Telehealth: Payer: Self-pay | Admitting: Urology

## 2017-02-07 ENCOUNTER — Telehealth: Payer: Self-pay | Admitting: Radiation Oncology

## 2017-02-07 NOTE — Telephone Encounter (Signed)
Phoned Dr. Simone Curia office. Spoke with his nurse, Alroy Dust. Explained the patient is unable to undergo brachytherapy because his cardiologist doesn't want him to go off Plavix (stents). Went onto to explain the patient will follow up with his cardiologist again in September and brachytherapy can be revisited then. Requested Alroy Dust relay this information to Dr. Karsten Ro in the event he would like to move forward with Lupron in the interim. She verbalized understanding. Then, I phoned the patient and explained Dr. Karsten Ro office would be in contact with him reference ADT. He verbalized understanding.

## 2017-02-07 NOTE — Telephone Encounter (Signed)
Received voicemail message from patient requesting return call. Phoned patient back. Spoke with patient's wife. She explained her husband is unable to come off Plavix to have the brachytherapy procedure and doesn't follow up with his cardiologist again until September. She goes onto explain "an injection was mentioned that slows the growth of the cancer" and questioned if they should move forward with that. She understands this RN will inform Dr. Tammi Klippel of these findings and phone back with further directions. She expressed appreciation for the call.

## 2017-02-07 NOTE — Telephone Encounter (Signed)
I spoke with the patient to inform that we are awaiting a return call from Dr. Simone Curia office regarding his appointment for initiation of Lupron ADT to prevent disease progression while we are awaiting cardiac clearance to stop his Plavix preoperatively for brachytherapy.  He will see his cardiologist for follow up in September and re-visit whether he can be cleared for brachytherapy at that time.  I will plan to follow up with patient in September to assess time frame for scheduling brachytherapy if/when he has been given cardiac clearance to proceed.  Patient states his understanding and agreement with the stated plan.

## 2017-02-07 NOTE — Telephone Encounter (Signed)
Enid Derry, will you please call Dr. Simone Curia office to get patient scheduled for initiation of Lupron ADT.  I will send a note to Dr. Karsten Ro regarding this patient's situation and need for Lupron due to inability to come off blood thinners until September, at earliest, preventing [place] from proceeding with brachytherapy until he is granted cardiac clearance to stop blood thinners.  I will also call and inform the patient of this plan.  Thank you!! -Demmi Sindt

## 2017-04-25 ENCOUNTER — Telehealth: Payer: Self-pay | Admitting: *Deleted

## 2017-04-25 NOTE — Telephone Encounter (Signed)
Called patient to ask question, lvm for a return call 

## 2017-05-06 ENCOUNTER — Telehealth: Payer: Self-pay | Admitting: *Deleted

## 2017-05-06 NOTE — Telephone Encounter (Signed)
CALLED PATIENT TO ASK QUESTION, LVM FOR A RETURN CALL 

## 2017-05-23 ENCOUNTER — Telehealth: Payer: Self-pay | Admitting: *Deleted

## 2017-05-23 NOTE — Telephone Encounter (Signed)
CALLED PATIENT TO GIVE AN UPDATE, SPOKE WITH PATIENT AND HE IS AWARE

## 2017-07-02 ENCOUNTER — Other Ambulatory Visit: Payer: Self-pay | Admitting: Urology

## 2017-07-03 ENCOUNTER — Telehealth: Payer: Self-pay | Admitting: *Deleted

## 2017-07-03 NOTE — Telephone Encounter (Signed)
CALLED PATIENT TO INFORM OF PRE-SEED APPT. FOR 07-04-17, LVM FOR A RETURN CALL

## 2017-07-04 ENCOUNTER — Other Ambulatory Visit: Payer: Self-pay | Admitting: Urology

## 2017-07-04 ENCOUNTER — Ambulatory Visit
Admission: RE | Admit: 2017-07-04 | Discharge: 2017-07-04 | Disposition: A | Discharge status: Home / Self Care | Payer: Medicare PPO | Source: Ambulatory Visit | Attending: Radiation Oncology | Admitting: Radiation Oncology

## 2017-07-04 ENCOUNTER — Encounter: Payer: Self-pay | Admitting: Urology

## 2017-07-04 DIAGNOSIS — C61 Malignant neoplasm of prostate: Secondary | ICD-10-CM | POA: Diagnosis not present

## 2017-07-04 NOTE — Progress Notes (Signed)
I called and spoke with Hilda Blades, NP with Dr. Ronnald Ramp (Cardiology at [facility]) regarding cardiac clearance.  Patient is scheduled for brachytherapy 08/22/16.  I requested that they specifically address the time frame safe for patient to d/c Plavix/ASA preoperatively (5-7 days).  She is passing this info along to Dr. Adah Salvage nurse and will get the form completed, addressing specifics regarding d/c meds, and faxed back to [place] today.   Nicholos Johns, PA-C

## 2017-07-04 NOTE — Progress Notes (Signed)
  Radiation Oncology         [phone]) [phone] ________________________________  Name: Tyler Farley MRN: [id number]  Date: 07/04/2017  DOB: 1961-06-10  SIMULATION AND TREATMENT PLANNING NOTE PUBIC ARCH STUDY  TI:RWERXV, [name], MD  Kathie Rhodes, MD  DIAGNOSIS: 56 y.o. gentleman with Stage T1c adenocarcinoma of the prostate with Gleason Score of 4+3, and PSA of 10.0     ICD-10-CM   1. Malignant neoplasm of prostate ([place]) C61     COMPLEX SIMULATION:  The patient presented today for evaluation for possible prostate seed implant. He was brought to the radiation planning suite and placed supine on the CT couch. A 3-dimensional image study set was obtained in upload to the planning computer. There, on each axial slice, I contoured the prostate gland. Then, using three-dimensional radiation planning tools I reconstructed the prostate in view of the structures from the transperineal needle pathway to assess for possible pubic arch interference. In doing so, I did not appreciate any pubic arch interference. Also, the patient's prostate volume was estimated based on the drawn structure. The volume was 24 cc.  Given the pubic arch appearance and prostate volume, patient remains a good candidate to proceed with prostate seed implant. Today, he freely provided informed written consent to proceed.    PLAN: The patient will undergo prostate seed implant.   ________________________________  Sheral Apley. Tammi Klippel, M.D.  This document serves as a record of services personally performed by Tyler Pita, MD. It was created on his behalf by Rae Lips, a trained medical scribe. The creation of this record is based on the scribe's personal observations and the provider's statements to them. This document has been checked and approved by the attending provider.

## 2017-07-09 ENCOUNTER — Telehealth: Payer: Self-pay | Admitting: *Deleted

## 2017-07-09 NOTE — Telephone Encounter (Signed)
Called patient to inform of implant date, spoke with patient and he is aware of this date. 

## 2017-07-11 ENCOUNTER — Other Ambulatory Visit: Payer: Self-pay | Admitting: Urology

## 2017-07-11 DIAGNOSIS — C61 Malignant neoplasm of prostate: Secondary | ICD-10-CM

## 2017-08-14 ENCOUNTER — Telehealth: Payer: Self-pay | Admitting: *Deleted

## 2017-08-14 NOTE — Patient Instructions (Addendum)
Tyler Farley  08/14/2017   Your procedure is scheduled on: 08-22-17   Report to [facility] Main  Entrance Follow signs to Short Stay on first floor at 5:30 AM    Call this number if you have problems the morning of surgery [phone]   Remember: Do not eat food or drink liquids :After Midnight.     Take these medicines the morning of surgery with A SIP OF WATER: Carvedilol (Coreg)   DO NOT TAKE ANY DIABETIC MEDICATIONS DAY OF YOUR SURGERY                               You may not have any metal on your body including hair pins and              piercings  Do not wear jewelry, lotions, powders or deodorant             Men may shave face and neck.   Do not bring valuables to the hospital. Kevin.  Contacts, dentures or bridgework may not be worn into surgery.     Patients discharged the day of surgery will not be allowed to drive home.  Name and phone number of your driver: Wynona Canes [phone]                Please read over the following fact sheets you were given: _____________________________________________________________________  How to Manage Your Diabetes Before and After Surgery  Why is it important to control my blood sugar before and after surgery? . Improving blood sugar levels before and after surgery helps healing and can limit problems. . A way of improving blood sugar control is eating a healthy diet by: o  Eating less sugar and carbohydrates o  Increasing activity/exercise o  Talking with your doctor about reaching your blood sugar goals . High blood sugars (greater than 180 mg/dL) can raise your risk of infections and slow your recovery, so you will need to focus on controlling your diabetes during the weeks before surgery. . Make sure that the doctor who takes care of your diabetes knows about your planned surgery including the date and location.  How do I manage my blood sugar  before surgery? . Check your blood sugar at least 4 times a day, starting 2 days before surgery, to make sure that the level is not too high or low. o Check your blood sugar the morning of your surgery when you wake up and every 2 hours until you get to the Short Stay unit. . If your blood sugar is less than 70 mg/dL, you will need to treat for low blood sugar: o Do not take insulin. o Treat a low blood sugar (less than 70 mg/dL) with  cup of clear juice (cranberry or apple), 4 glucose tablets, OR glucose gel. o Recheck blood sugar in 15 minutes after treatment (to make sure it is greater than 70 mg/dL). If your blood sugar is not greater than 70 mg/dL on recheck, call [phone] for further instructions. . Report your blood sugar to the short stay nurse when you get to Short Stay.  . If you are admitted to the hospital after surgery: o Your blood sugar will be checked by the staff  and you will probably be given insulin after surgery (instead of oral diabetes medicines) to make sure you have good blood sugar levels. o The goal for blood sugar control after surgery is 80-180 mg/dL.   WHAT DO I DO ABOUT MY DIABETES MEDICATION?  Marland Kitchen Do not take oral diabetes medicines (pills) the morning of surgery.        THE DAY OF SURGERY TAKE YOUR USUAL DOSE OF SLIDING SCALE NOVOLOG  INSULIN with meals         . THE NIGHT BEFORE SURGERY, take only 10 units of Lantus insulin.       Reviewed and Endorsed by [facility] Patient Education Committee, August 2015            [facility] - Preparing for Surgery Before surgery, you can play an important role.  Because skin is not sterile, your skin needs to be as free of germs as possible.  You can reduce the number of germs on your skin by washing with CHG (chlorahexidine gluconate) soap before surgery.  CHG is an antiseptic cleaner which kills germs and bonds with the skin to continue killing germs even after washing. Please DO NOT use if you have an allergy  to CHG or antibacterial soaps.  If your skin becomes reddened/irritated stop using the CHG and inform your nurse when you arrive at Short Stay. Do not shave (including legs and underarms) for at least 48 hours prior to the first CHG shower.  You may shave your face/neck. Please follow these instructions carefully:  1.  Shower with CHG Soap the night before surgery and the  morning of Surgery.  2.  If you choose to wash your hair, wash your hair first as usual with your  normal  shampoo.  3.  After you shampoo, rinse your hair and body thoroughly to remove the  shampoo.                           4.  Use CHG as you would any other liquid soap.  You can apply chg directly  to the skin and wash                       Gently with a scrungie or clean washcloth.  5.  Apply the CHG Soap to your body ONLY FROM THE NECK DOWN.   Do not use on face/ open                           Wound or open sores. Avoid contact with eyes, ears mouth and genitals (private parts).                       Wash face,  Genitals (private parts) with your normal soap.             6.  Wash thoroughly, paying special attention to the area where your surgery  will be performed.  7.  Thoroughly rinse your body with warm water from the neck down.  8.  DO NOT shower/wash with your normal soap after using and rinsing off  the CHG Soap.                9.  Pat yourself dry with a clean towel.            10.  Wear clean pajamas.  11.  Place clean sheets on your bed the night of your first shower and do not  sleep with pets. Day of Surgery : Do not apply any lotions/deodorants the morning of surgery.  Please wear clean clothes to the hospital/surgery center.  FAILURE TO FOLLOW THESE INSTRUCTIONS MAY RESULT IN THE CANCELLATION OF YOUR SURGERY PATIENT SIGNATURE_________________________________  NURSE SIGNATURE__________________________________  ________________________________________________________________________

## 2017-08-14 NOTE — Telephone Encounter (Signed)
Called patient to remind of labs for 08-15-17, lvm for a return call

## 2017-08-15 ENCOUNTER — Other Ambulatory Visit: Payer: Self-pay

## 2017-08-15 ENCOUNTER — Encounter (HOSPITAL_COMMUNITY): Payer: Self-pay

## 2017-08-15 ENCOUNTER — Encounter (HOSPITAL_COMMUNITY)
Admission: RE | Admit: 2017-08-15 | Discharge: 2017-08-15 | Disposition: A | Discharge status: Home / Self Care | Payer: Medicare PPO | Source: Ambulatory Visit | Attending: Urology | Admitting: Urology

## 2017-08-15 ENCOUNTER — Ambulatory Visit (HOSPITAL_COMMUNITY)
Admission: RE | Admit: 2017-08-15 | Discharge: 2017-08-15 | Disposition: A | Discharge status: Home / Self Care | Payer: Medicare PPO | Source: Ambulatory Visit | Attending: Urology | Admitting: Urology

## 2017-08-15 DIAGNOSIS — Z01810 Encounter for preprocedural cardiovascular examination: Secondary | ICD-10-CM | POA: Insufficient documentation

## 2017-08-15 DIAGNOSIS — C61 Malignant neoplasm of prostate: Secondary | ICD-10-CM | POA: Insufficient documentation

## 2017-08-15 DIAGNOSIS — Z01818 Encounter for other preprocedural examination: Secondary | ICD-10-CM | POA: Insufficient documentation

## 2017-08-15 DIAGNOSIS — Z01812 Encounter for preprocedural laboratory examination: Secondary | ICD-10-CM | POA: Insufficient documentation

## 2017-08-15 LAB — COMPREHENSIVE METABOLIC PANEL
ALT: 39 U/L (ref 17–63)
AST: 19 U/L (ref 15–41)
Albumin: 4.3 g/dL (ref 3.5–5.0)
Alkaline Phosphatase: 73 U/L (ref 38–126)
Anion gap: 6 (ref 5–15)
BUN: 31 mg/dL — ABNORMAL HIGH (ref 6–20)
CO2: 26 mmol/L (ref 22–32)
Calcium: 9.3 mg/dL (ref 8.9–10.3)
Chloride: 106 mmol/L (ref 101–111)
Creatinine, Ser: 1.22 mg/dL (ref 0.61–1.24)
GFR calc Af Amer: >60 mL/min (ref >60)
GFR calc non Af Amer: >60 mL/min (ref >60)
Glucose, Bld: 145 mg/dL — ABNORMAL HIGH (ref 65–99)
Potassium: 4.7 mmol/L (ref 3.5–5.1)
Sodium: 138 mmol/L (ref 135–145)
Total Bilirubin: 0.3 mg/dL (ref 0.3–1.2)
Total Protein: 7 g/dL (ref 6.5–8.1)

## 2017-08-15 LAB — HEMOGLOBIN A1C
Hgb A1c MFr Bld: 6.1 % — ABNORMAL HIGH (ref 4.8–5.6)
MEAN PLASMA GLUCOSE: 128.37 mg/dL

## 2017-08-15 LAB — CBC
HCT: 35.1 % — ABNORMAL LOW (ref 39.0–52.0)
Hemoglobin: 11.6 g/dL — ABNORMAL LOW (ref 13.0–17.0)
MCH: 30.9 pg (ref 26.0–34.0)
MCHC: 33 g/dL (ref 30.0–36.0)
MCV: 93.6 fL (ref 78.0–100.0)
Platelets: 263 10*3/uL (ref 150–400)
RBC: 3.75 MIL/uL — ABNORMAL LOW (ref 4.22–5.81)
RDW: 12.2 % (ref 11.5–15.5)
WBC: 4.7 10*3/uL (ref 4.0–10.5)

## 2017-08-15 LAB — PROTIME-INR
INR: 0.96
Prothrombin Time: 12.7 seconds (ref 11.4–15.2)

## 2017-08-15 LAB — GLUCOSE, CAPILLARY: Glucose-Capillary: 124 mg/dL — ABNORMAL HIGH (ref 65–99)

## 2017-08-15 LAB — APTT: aPTT: 26 seconds (ref 24–36)

## 2017-08-15 NOTE — Progress Notes (Signed)
07-02-17 Cardiac clearance from Dr. Ronnald Ramp on chart  04-24-17 Last office visit from Dr. Ronnald Ramp on chart  01-29-17 EKG on chart

## 2017-08-15 NOTE — Progress Notes (Signed)
08-15-17 CMP result routed to Dr. Karsten Ro for review.

## 2017-08-21 ENCOUNTER — Encounter (HOSPITAL_COMMUNITY): Payer: Self-pay | Admitting: Certified Registered Nurse Anesthetist

## 2017-08-21 ENCOUNTER — Telehealth: Payer: Self-pay | Admitting: *Deleted

## 2017-08-21 NOTE — H&P (Signed)
HPI: Tyler Farley is a 57 year-old male who presents for radioactive seed implant.  His prostate cancer was diagnosed 12/10/2016. His PSA at his time of diagnosis was 10.0. His cancer was T1c, Gleason 4+3 = 7 in 2 cores.   Adenocarcinoma the prostate:  PSA 10.0, no abnormality on DRE.  TRUS/BX 12/10/16: Prostate volume - 33 cc  Pathology: Adenocarcinoma Gleason score 4+3 = 7 in 2 cores.  Stage: TIc    ALLERGIES: No Allergies    MEDICATIONS: Hydrochlorothiazide 12.5 mg capsule  Levaquin 750 mg tablet Take the morning of your prostate biopsy.  Lipitor 20 mg tablet  Plavix 75 mg tablet  Adult Aspirin Regimen 81 mg tablet, delayed release  Alpha Lipoic Acid  Arginine 500 mg tablet  Baclofen 10 mg tablet  Carvedilol 12.5 mg tablet  [place]  Co Q10  Lantus 100 unit/ml vial  L-Carnitine  Losartan Potassium 100 mg tablet  Novolog 100 unit/ml vial  Vitamin B Complex     GU PSH: Prostate Needle Biopsy - 12/10/2016    NON-GU PSH: Leg surgery (unspecified), Bilateral Surgical Pathology, Gross And Microscopic Examination For Prostate Needle - 12/10/2016    GU PMH: Prostate Cancer, I went over his pathology report with him today as well as the significance of his Gleason score, number and location of cores positive and percent of cores positive. We then discussed his Partin table results in detail and the significance of these predictions as far as prognosis and need for further workup are concerned. I then discussed with him the various options available including active surveillance and treatment for cure such as radiation and surgery. We briefly discussed the forms of radiation available. I also gave him written information outlining the disease, its workup and the options for treatment for him to review further. - 12/17/2016 Elevated PSA (Stable), I have discussed with the patient the possibility of blood per rectum, per urethra and in the ejaculate. He was counseled to contact me if he has any  difficulties following his prostate biopsy whatsoever. - 12/10/2016, I have discussed with the patient the possible need for further evaluation of his elevated PSA. We have discussed the options which would be continued observation with serial DRE and PSAs versus proceeding with further evaluation at this time with TRUS/Bx. We have discussed the possible risk of progression and spread of prostate cancer if present currently as well as the fact that typically prostate cancer tends to be a relatively slow-growing form of cancer that typically would not progress significantly over the relative short period of time between serial examinations. We also have discussed proceeding at this time with a prostate biopsy and I therefore have gone over the procedure with him in detail. We have discussed its potential risks and complications as well as limitations. I have answered all of his questions regarding this procedure to his satisfaction and he has elected to proceed with a prostate biopsy at this time., - 11/25/2016, Elevated PSA, - 2016 ED due to arterial insufficiency, Erectile dysfunction due to arterial insufficiency - 2016 History of urolithiasis, History of renal calculi - 2016    NON-GU PMH: Diabetes Type 2 Encounter for general adult medical examination without abnormal findings, Encounter for preventive health examination Hypercholesterolemia Hypertension Stroke/TIA    FAMILY HISTORY: 2 daughters - Other 1 son - Other Death of family member - Father   SOCIAL HISTORY: Marital Status: Married Current Smoking Status: Patient has never smoked.   Tobacco Use Assessment Completed: Used Tobacco in last  30 days? Has never drank.  Drinks 4+ caffeinated drinks per day.    REVIEW OF SYSTEMS:    GU Review Male:   Patient denies frequent urination, hard to postpone urination, get up at night to urinate, erection problems, burning/ pain with urination, stream starts and stops, penile pain, trouble  starting your stream, have to strain to urinate , and leakage of urine.  Gastrointestinal (Upper):   Patient denies nausea, vomiting, and indigestion/ heartburn.  Gastrointestinal (Lower):   Patient denies diarrhea and constipation.  Constitutional:   Patient denies fever, night sweats, weight loss, and fatigue.  Skin:   Patient denies skin rash/ lesion and itching.  Eyes:   Patient denies blurred vision and double vision.  Ears/ Nose/ Throat:   Patient denies sore throat and sinus problems.  Hematologic/Lymphatic:   Patient denies swollen glands and easy bruising.  Cardiovascular:   Patient denies leg swelling and chest pains.  Respiratory:   Patient denies cough and shortness of breath.  Endocrine:   Patient denies excessive thirst.  Musculoskeletal:   Patient denies back pain and joint pain.  Neurological:   Patient denies headaches and dizziness.  Psychologic:   Patient denies depression and anxiety.   VITAL SIGNS:  Weight 160 lb / 72.57 kg  Height 66 in / 167.64 cm  BP 140/80 mmHg  Pulse 86 /min  Pulse Oximetry 98 %  BMI 25.8 kg/m    MULTI-SYSTEM PHYSICAL EXAMINATION:    Constitutional: Well-nourished. No physical deformities. Normally developed. Good grooming.  Neck: Neck symmetrical, not swollen. Normal tracheal position.  Respiratory: No labored breathing, no use of accessory muscles.   Cardiovascular: Normal temperature, normal extremity pulses, no swelling, no varicosities.  Lymphatic: No enlargement of neck, axillae, groin.  Skin: No paleness, no jaundice, no cyanosis. No lesion, no ulcer, no rash.  Neurologic / Psychiatric: Oriented to time, oriented to place, oriented to person. No depression, no anxiety, no agitation. 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  Gastrointestinal: No mass, no tenderness, no rigidity, non obese abdomen.  Eyes: Normal conjunctivae. Normal eyelids.  Ears, Nose, Mouth, and Throat: Left ear no scars, no lesions, no masses.  Right ear no scars, no lesions, no masses. Nose no scars, no lesions, no masses. Normal hearing. Normal lips.  Musculoskeletal: Left upper extremity atrophy. Left lower extremity atrophy, abnormal movements. Normal gait and station of head and neck.    PAST DATA REVIEWED:  Source Of History:  Patient  Records Review:   Previous Patient Records  X-Ray Review: C.T. Pelvis: Reviewed Films. Reviewed Report. Discussed With Patient. EXAM: CT PELVIS WITHOUT CONTRAST TECHNIQUE: Multidetector CT imaging of the pelvis was performed following the standard protocol without intravenous contrast. COMPARISON: None. FINDINGS: Urinary Tract: Normal bladder. Distal ureters are normal caliber. Bowel: Unremarkable visualized pelvic bowel loops. Vascular/Lymphatic: No pathologically enlarged lymph nodes. Iliofemoral atherosclerosis bilaterally. Reproductive: Mildly enlarged prostate. Prostate dimensions 4.4 x 3.5 x 4.8 cm (volume = 39 cm^3). No discrete prostatic mass. Other: Small fat containing left inguinal hernia. Musculoskeletal: No aggressive appearing bone lesions identified. Moderate bilateral hip osteoarthritis. IMPRESSION: 1. No adenopathy or other findings of metastatic disease in the pelvis . 2. Mildly enlarged prostate. 3. Small fat containing left inguinal hernia.    10/04/16 05/27/16 02/21/16 10/02/15 08/26/14 08/12/14  PSA  Total PSA 10.0 ng/dl 9.24 ng/dl 7.78 ng/dl 6.70 ng/dl 5.15 ng/dl 10.51 ng/dl   Notes:  I reviewed the results of his pelvic CT scan which has revealed no evidence of extraprostatic spread grossly or adenopathy. He was very pleased to hear this.   PROCEDURES: None   ASSESSMENT/PLAN:  Prostate Cancer - C61 Stable - In his particular situation because he just had a drug eluding stent placed he is going to be on Plavix and aspirin for 6 months. He would need to be placed on Lupron if he was to proceed with surgery or radioactive seed implantation. The other option  would be to proceed with external beam radiation. There would be some risk at placing fiducial markers with him on Plavix so if it's possible it would be preferable to perform his IMRT without placement of fiducial markers although I'm going to leave that final decision up to Dr. Tammi Klippel.  The patient was counseled about the natural history of prostate cancer and the standard treatment options that are available for prostate cancer. It was explained to him how his age and life expectancy, clinical stage, Gleason score, and PSA affect his prognosis, the decision to proceed with additional staging studies, as well as how that information influences recommended treatment strategies. We discussed the roles for active surveillance, radiation therapy, surgical therapy, androgen deprivation, as well as ablative therapy options for the treatment of prostate cancer as appropriate to his individual cancer situation. We discussed the risks and benefits of these options with regard to their impact on cancer control and also in terms of potential adverse events, complications, and impact on quality of life particularly related to urinary, bowel, and sexual function. The patient was encouraged to ask questions throughout the discussion today and all questions were answered to his stated satisfaction. In addition, the patient was provided with and/or directed to appropriate resources and literature for further education about prostate cancer and treatment options.  We discussed surgical therapy for prostate cancer including the different available surgical approaches. We discussed, in detail, the risks and expectations of surgery with regard to cancer control, urinary control, and erectile function as well as the expected postoperative recovery process. The risks, potential complications/adverse events of radical prostatectomy as well as alternative options were explained to the patient.    He has elected to proceed with  radioactive seed implant.  He will stop his Plavix in preparation.

## 2017-08-21 NOTE — Telephone Encounter (Signed)
CALLED PATIENT TO REMIND OF PROCEDURE FOR 08-22-17, SPOKE WITH PATIENT AND HE IS AWARE OF THIS PROCEDURE.

## 2017-08-21 NOTE — Anesthesia Preprocedure Evaluation (Addendum)
Anesthesia Evaluation  Patient identified by MRN, date of birth, ID band Patient confused    Reviewed: Allergy & Precautions, NPO status , Patient's Chart, lab work & pertinent test results  Airway Mallampati: II  TM Distance: >3 FB Neck ROM: Full    Dental no notable dental hx.    Pulmonary neg pulmonary ROS,    Pulmonary exam normal breath sounds clear to auscultation       Cardiovascular hypertension, + Peripheral Vascular Disease  negative cardio ROS Normal cardiovascular exam Rhythm:Regular Rate:Normal     Neuro/Psych CVA negative neurological ROS     GI/Hepatic negative GI ROS, Neg liver ROS,   Endo/Other  diabetes  Renal/GU Renal diseasenegative Renal ROS  negative genitourinary   Musculoskeletal negative musculoskeletal ROS (+)   Abdominal   Peds  Hematology negative hematology ROS (+)   Anesthesia Other Findings   Reproductive/Obstetrics                             Lab Results  Component Value Date   CREATININE 1.22 08/15/2017   BUN 31 (H) 08/15/2017   NA 138 08/15/2017   K 4.7 08/15/2017   CL 106 08/15/2017   CO2 26 08/15/2017    Lab Results  Component Value Date   WBC 4.7 08/15/2017   HGB 11.6 (L) 08/15/2017   HCT 35.1 (L) 08/15/2017   MCV 93.6 08/15/2017   PLT 263 08/15/2017    Anesthesia Physical Anesthesia Plan  ASA: III  Anesthesia Plan: General   Post-op Pain Management:    Induction: Intravenous  PONV Risk Score and Plan: 2 and Treatment may vary due to age or medical condition, Ondansetron and Dexamethasone  Airway Management Planned: Oral ETT and LMA  Additional Equipment:   Intra-op Plan:   Post-operative Plan:   Informed Consent: I have reviewed the patients History and Physical, chart, labs and discussed the procedure including the risks, benefits and alternatives for the proposed anesthesia with the patient or authorized representative  who has indicated his/her understanding and acceptance.   Dental advisory given  Plan Discussed with: Anesthesiologist and CRNA  Anesthesia Plan Comments:         Anesthesia Quick Evaluation

## 2017-08-22 ENCOUNTER — Encounter (HOSPITAL_COMMUNITY)
Admission: RE | Disposition: A | Discharge status: Home / Self Care | Payer: Self-pay | Source: Ambulatory Visit | Attending: Urology

## 2017-08-22 ENCOUNTER — Ambulatory Visit (HOSPITAL_COMMUNITY): Payer: Medicare PPO

## 2017-08-22 ENCOUNTER — Ambulatory Visit (HOSPITAL_COMMUNITY): Payer: Medicare PPO | Admitting: Certified Registered Nurse Anesthetist

## 2017-08-22 ENCOUNTER — Encounter (HOSPITAL_COMMUNITY): Payer: Self-pay | Admitting: Emergency Medicine

## 2017-08-22 ENCOUNTER — Ambulatory Visit (HOSPITAL_COMMUNITY)
Admission: RE | Admit: 2017-08-22 | Discharge: 2017-08-22 | Disposition: A | Discharge status: Home / Self Care | Payer: Medicare PPO | Source: Ambulatory Visit | Attending: Urology | Admitting: Urology

## 2017-08-22 ENCOUNTER — Other Ambulatory Visit: Payer: Self-pay

## 2017-08-22 DIAGNOSIS — Z794 Long term (current) use of insulin: Secondary | ICD-10-CM | POA: Insufficient documentation

## 2017-08-22 DIAGNOSIS — Z8673 Personal history of transient ischemic attack (TIA), and cerebral infarction without residual deficits: Secondary | ICD-10-CM | POA: Insufficient documentation

## 2017-08-22 DIAGNOSIS — E7800 Pure hypercholesterolemia, unspecified: Secondary | ICD-10-CM | POA: Insufficient documentation

## 2017-08-22 DIAGNOSIS — N35919 Unspecified urethral stricture, male, unspecified site: Secondary | ICD-10-CM | POA: Insufficient documentation

## 2017-08-22 DIAGNOSIS — I10 Essential (primary) hypertension: Secondary | ICD-10-CM | POA: Diagnosis not present

## 2017-08-22 DIAGNOSIS — Z79899 Other long term (current) drug therapy: Secondary | ICD-10-CM | POA: Insufficient documentation

## 2017-08-22 DIAGNOSIS — C61 Malignant neoplasm of prostate: Secondary | ICD-10-CM | POA: Diagnosis not present

## 2017-08-22 DIAGNOSIS — Z7982 Long term (current) use of aspirin: Secondary | ICD-10-CM | POA: Insufficient documentation

## 2017-08-22 HISTORY — PX: SPACE OAR INSTILLATION: SHX6769

## 2017-08-22 HISTORY — PX: RADIOACTIVE SEED IMPLANT: SHX5150

## 2017-08-22 LAB — GLUCOSE, CAPILLARY
Glucose-Capillary: 212 mg/dL — ABNORMAL HIGH (ref 65–99)
Glucose-Capillary: 237 mg/dL — ABNORMAL HIGH (ref 65–99)
Glucose-Capillary: 253 mg/dL — ABNORMAL HIGH (ref 65–99)
Glucose-Capillary: 294 mg/dL — ABNORMAL HIGH (ref 65–99)

## 2017-08-22 SURGERY — INSERTION, RADIATION SOURCE, PROSTATE
Anesthesia: General

## 2017-08-22 MED ORDER — PROPOFOL 10 MG/ML IV BOLUS
INTRAVENOUS | Status: DC | PRN
  Administered 2017-08-22: 150 mg via INTRAVENOUS

## 2017-08-22 MED ORDER — DEXAMETHASONE SODIUM PHOSPHATE 10 MG/ML IJ SOLN
INTRAMUSCULAR | Status: AC
  Filled 2017-08-22: qty 1

## 2017-08-22 MED ORDER — DEXAMETHASONE SODIUM PHOSPHATE 4 MG/ML IJ SOLN
INTRAMUSCULAR | Status: DC | PRN
  Administered 2017-08-22: 4 mg via INTRAVENOUS

## 2017-08-22 MED ORDER — STERILE WATER FOR IRRIGATION IR SOLN
Status: DC | PRN
  Administered 2017-08-22: 3000 mL

## 2017-08-22 MED ORDER — MIDAZOLAM HCL 2 MG/2ML IJ SOLN
INTRAMUSCULAR | Status: AC
  Filled 2017-08-22: qty 2

## 2017-08-22 MED ORDER — STERILE WATER FOR IRRIGATION IR SOLN
Status: DC | PRN
  Administered 2017-08-22: 1000 mL

## 2017-08-22 MED ORDER — INSULIN ASPART 100 UNIT/ML ~~LOC~~ SOLN
SUBCUTANEOUS | Status: AC
  Filled 2017-08-22: qty 1

## 2017-08-22 MED ORDER — CIPROFLOXACIN IN D5W 400 MG/200ML IV SOLN
400.0000 mg | INTRAVENOUS | Status: AC
  Administered 2017-08-22: 400 mg via INTRAVENOUS
  Filled 2017-08-22: qty 200

## 2017-08-22 MED ORDER — ONDANSETRON HCL 4 MG/2ML IJ SOLN
INTRAMUSCULAR | Status: DC | PRN
  Administered 2017-08-22: 4 mg via INTRAVENOUS

## 2017-08-22 MED ORDER — HYDROMORPHONE HCL 1 MG/ML IJ SOLN
INTRAMUSCULAR | Status: AC
  Filled 2017-08-22: qty 1

## 2017-08-22 MED ORDER — HYDROCODONE-ACETAMINOPHEN 10-325 MG PO TABS: 1.0000 | ORAL_TABLET | ORAL | 0 refills | Status: DC | PRN

## 2017-08-22 MED ORDER — LACTATED RINGERS IV SOLN
INTRAVENOUS | Status: DC | PRN
  Administered 2017-08-22 (×2): via INTRAVENOUS

## 2017-08-22 MED ORDER — FENTANYL CITRATE (PF) 100 MCG/2ML IJ SOLN
INTRAMUSCULAR | Status: AC
  Filled 2017-08-22: qty 2

## 2017-08-22 MED ORDER — ONDANSETRON HCL 4 MG/2ML IJ SOLN
INTRAMUSCULAR | Status: AC
  Filled 2017-08-22: qty 2

## 2017-08-22 MED ORDER — IOHEXOL 300 MG/ML  SOLN
INTRAMUSCULAR | Status: DC | PRN
  Administered 2017-08-22: 5 mL

## 2017-08-22 MED ORDER — LIDOCAINE 2% (20 MG/ML) 5 ML SYRINGE
INTRAMUSCULAR | Status: DC | PRN
  Administered 2017-08-22: 80 mg via INTRAVENOUS

## 2017-08-22 MED ORDER — EPHEDRINE 5 MG/ML INJ
INTRAVENOUS | Status: AC
  Filled 2017-08-22: qty 10

## 2017-08-22 MED ORDER — PHENYLEPHRINE 40 MCG/ML (10ML) SYRINGE FOR IV PUSH (FOR BLOOD PRESSURE SUPPORT)
PREFILLED_SYRINGE | INTRAVENOUS | Status: DC | PRN
  Administered 2017-08-22 (×7): 80 ug via INTRAVENOUS

## 2017-08-22 MED ORDER — HYDROMORPHONE HCL 1 MG/ML IJ SOLN: 0.2500 mg | INTRAMUSCULAR | Status: DC | PRN

## 2017-08-22 MED ORDER — FLEET ENEMA 7-19 GM/118ML RE ENEM
1.0000 | ENEMA | Freq: Once | RECTAL | Status: DC
  Filled 2017-08-22: qty 1

## 2017-08-22 MED ORDER — PHENYLEPHRINE 40 MCG/ML (10ML) SYRINGE FOR IV PUSH (FOR BLOOD PRESSURE SUPPORT)
PREFILLED_SYRINGE | INTRAVENOUS | Status: AC
  Filled 2017-08-22: qty 10

## 2017-08-22 MED ORDER — MEPERIDINE HCL 50 MG/ML IJ SOLN: 6.2500 mg | INTRAMUSCULAR | Status: DC | PRN

## 2017-08-22 MED ORDER — PROPOFOL 10 MG/ML IV BOLUS
INTRAVENOUS | Status: AC
  Filled 2017-08-22: qty 20

## 2017-08-22 MED ORDER — EPHEDRINE SULFATE-NACL 50-0.9 MG/10ML-% IV SOSY
PREFILLED_SYRINGE | INTRAVENOUS | Status: DC | PRN
  Administered 2017-08-22 (×2): 5 mg via INTRAVENOUS

## 2017-08-22 MED ORDER — ACETAMINOPHEN 10 MG/ML IV SOLN
INTRAVENOUS | Status: AC
  Administered 2017-08-22: 1000 mg via INTRAVENOUS
  Filled 2017-08-22: qty 100

## 2017-08-22 MED ORDER — PROMETHAZINE HCL 25 MG/ML IJ SOLN: 6.2500 mg | INTRAMUSCULAR | Status: DC | PRN

## 2017-08-22 MED ORDER — LIDOCAINE 2% (20 MG/ML) 5 ML SYRINGE
INTRAMUSCULAR | Status: AC
  Filled 2017-08-22: qty 5

## 2017-08-22 MED ORDER — SUCCINYLCHOLINE CHLORIDE 200 MG/10ML IV SOSY
PREFILLED_SYRINGE | INTRAVENOUS | Status: AC
  Filled 2017-08-22: qty 10

## 2017-08-22 MED ORDER — HYDROCODONE-ACETAMINOPHEN 7.5-325 MG PO TABS: 1.0000 | ORAL_TABLET | Freq: Once | ORAL | Status: DC | PRN

## 2017-08-22 MED ORDER — CIPROFLOXACIN HCL 500 MG PO TABS: 500.0000 mg | ORAL_TABLET | Freq: Two times a day (BID) | ORAL | 0 refills | Status: DC

## 2017-08-22 MED ORDER — ACETAMINOPHEN 10 MG/ML IV SOLN
1000.0000 mg | Freq: Once | INTRAVENOUS | Status: DC | PRN
  Administered 2017-08-22: 1000 mg via INTRAVENOUS

## 2017-08-22 MED ORDER — INSULIN ASPART 100 UNIT/ML ~~LOC~~ SOLN
5.0000 [IU] | Freq: Once | SUBCUTANEOUS | Status: AC
  Administered 2017-08-22: 5 [IU] via SUBCUTANEOUS

## 2017-08-22 MED ORDER — METHOCARBAMOL 1000 MG/10ML IJ SOLN
500.0000 mg | Freq: Once | INTRAVENOUS | Status: AC
  Administered 2017-08-22: 500 mg via INTRAVENOUS
  Filled 2017-08-22: qty 550

## 2017-08-22 MED ORDER — ROCURONIUM BROMIDE 50 MG/5ML IV SOSY
PREFILLED_SYRINGE | INTRAVENOUS | Status: AC
  Filled 2017-08-22: qty 5

## 2017-08-22 MED ORDER — FENTANYL CITRATE (PF) 100 MCG/2ML IJ SOLN
INTRAMUSCULAR | Status: DC | PRN
  Administered 2017-08-22: 25 ug via INTRAVENOUS
  Administered 2017-08-22: 50 ug via INTRAVENOUS
  Administered 2017-08-22 (×5): 25 ug via INTRAVENOUS
  Administered 2017-08-22: 50 ug via INTRAVENOUS

## 2017-08-22 MED ORDER — MIDAZOLAM HCL 5 MG/5ML IJ SOLN
INTRAMUSCULAR | Status: DC | PRN
  Administered 2017-08-22: 2 mg via INTRAVENOUS

## 2017-08-22 SURGICAL SUPPLY — 26 items
BAG URINE DRAINAGE (UROLOGICAL SUPPLIES) ×2 IMPLANT
BLADE CLIPPER SURG (BLADE) ×2 IMPLANT
CATH FOLEY 2WAY SLVR  5CC 14FR (CATHETERS) ×1
CATH FOLEY 2WAY SLVR  5CC 16FR (CATHETERS) ×2
CATH FOLEY 2WAY SLVR 5CC 14FR (CATHETERS) ×1 IMPLANT
CATH FOLEY 2WAY SLVR 5CC 16FR (CATHETERS) ×2 IMPLANT
CATH ROBINSON RED A/P 16FR (CATHETERS) ×2 IMPLANT
CATH ROBINSON RED A/P 20FR (CATHETERS) ×2 IMPLANT
COVER BACK TABLE 60X90IN (DRAPES) ×2 IMPLANT
COVER MAYO STAND STRL (DRAPES) ×2 IMPLANT
COVER SURGICAL LIGHT HANDLE (MISCELLANEOUS) ×2 IMPLANT
DRAPE BACK TABLE (DRAPES) ×2 IMPLANT
DRSG TEGADERM 4X4.75 (GAUZE/BANDAGES/DRESSINGS) IMPLANT
DRSG TEGADERM 8X12 (GAUZE/BANDAGES/DRESSINGS) ×2 IMPLANT
GAUZE SPONGE 4X4 12PLY STRL (GAUZE/BANDAGES/DRESSINGS) ×2 IMPLANT
GLOVE BIO SURGEON STRL SZ8 (GLOVE) ×2 IMPLANT
GOWN STRL REUS W/ TWL XL LVL3 (GOWN DISPOSABLE) ×2 IMPLANT
GOWN STRL REUS W/TWL XL LVL3 (GOWN DISPOSABLE) ×4 IMPLANT
HOLDER FOLEY CATH W/STRAP (MISCELLANEOUS) ×2 IMPLANT
IMPL SPACEOAR SYSTEM 10ML (MISCELLANEOUS) ×1 IMPLANT
IMPLANT SPACEOAR SYSTEM 10ML (MISCELLANEOUS) ×2
PACK CYSTO (CUSTOM PROCEDURE TRAY) ×2 IMPLANT
SUT BONE WAX W31G (SUTURE) ×2 IMPLANT
SYR 10ML LL (SYRINGE) ×2 IMPLANT
TOWEL OR 17X26 10 PK STRL BLUE (TOWEL DISPOSABLE) ×4 IMPLANT
UNDERPAD 30X30 (UNDERPADS AND DIAPERS) ×4 IMPLANT

## 2017-08-22 NOTE — Anesthesia Procedure Notes (Signed)
Procedure Name: LMA Insertion Date/Time: 08/22/2017 8:07 AM Performed by: Claudia Desanctis, CRNA Pre-anesthesia Checklist: Emergency Drugs available, Patient identified, Suction available and Patient being monitored Patient Re-evaluated:Patient Re-evaluated prior to induction Oxygen Delivery Method: Circle system utilized Preoxygenation: Pre-oxygenation with 100% oxygen Induction Type: IV induction Ventilation: Mask ventilation without difficulty LMA: LMA inserted LMA Size: 4.0 Number of attempts: 1 Placement Confirmation: positive ETCO2 and breath sounds checked- equal and bilateral Tube secured with: Tape Dental Injury: Teeth and Oropharynx as per pre-operative assessment

## 2017-08-22 NOTE — Progress Notes (Signed)
Dr. Valma Cava notified patients blood glucose 253.  Per Dr. Valma Cava patient cleared for discharge.

## 2017-08-22 NOTE — Discharge Instructions (Addendum)
DISCHARGE INSTRUCTIONS FOR PROSTATE SEED IMPLANTATION  Antibiotics You may be given a prescription for an antibiotic to take when you arrive home. If so, be sure to take every tablet in the bottle, even if you are feeling better before the prescription is finished. If you begin itching, notice a rash or start to swell on  your trunk, arms, legs and/or throat, immediately stop taking the antibiotic and call your Urologist.  Plavix - restart your Plavix in 48 hours.  Diet Resume your usual diet when you return home. To keep your bowels moving easily and softly, drink prune, apple and cranberry juice at room temperature. You may also take a stool softener, such as Colace, which is available without prescription at local pharmacies. Daily activities ? No driving or heavy lifting for at least two days after the implant. ? No bike riding, horseback riding or riding lawn mowers for the first month after the implant. ? Any strenuous physical activity should be approved by your doctor before you resume it. Sexual relations You may resume sexual relations two weeks after the procedure. A condom should be used for the first two weeks. Your semen may be dark brown or black; this is normal and is related bleeding that may have occurred during the implant. Postoperative swelling Expect swelling and bruising of the scrotum and perineum (the area between the scrotum and anus). Both the swelling and the bruising should resolve in l or 2 weeks. Ice packs and over- the-counter medications such as Tylenol, Advil or Aleve may lessen your discomfort. Postoperative urination Most men experience burning on urination and/or urinary frequency. If this becomes bothersome, contact your Urologist.  Medication can be prescribed to relieve these problems.  It is normal to have some blood in your urine for a few days after the implant. Special instructions related to the seeds It is unlikely that you will pass an Iodine-125  seed in your urine. The seeds are silver in color and are about as large as a grain of rice. If you pass a seed, do not handle it with your fingers. Use a spoon to place it in an envelope or jar in place this in base occluded area such as the garage or basement for return to the radiation clinic at your convenience.  Contact your doctor for ? Temperature greater than 101 F ? Increasing pain ? Inability to urinate Follow-up  You should have follow up with your urologist and radiation oncologist about 3 weeks after the procedure. General information regarding Iodine seeds ? Iodine-125 is a low energy radioactive material. It is not deeply penetrating and loses energy at short distances. Your prostate will absorb the radiation. Objects that are touched or used by the patient do not become radioactive. ? Body wastes (urine and stool) or body fluids (saliva, tears, semen or blood) are not radioactive. ? The Nuclear Regulatory Commission [facility]) has determined that no radiation precautions are needed for patients undergoing Iodine-125 seed implantation. The [facility] states that such patients do not present a risk to the people around them, including young children and pregnant women. However, in keeping with the general principle that radiation exposure should be kept as low reasonably possible, we suggest the following: ? Children and pets should not sit on the patient's lap for the first two (2) weeks after the implant. ? Pregnant (or possibly pregnant) women should avoid prolonged, close contact with the patient for the first two (2) weeks after the implant. ? A distance of three (  3) feet is acceptable. ? At a distance of three (3) feet, there is no limit to the length of time anyone can be with the patient. ?    General Anesthesia, Adult, Care After These instructions provide you with information about caring for yourself after your procedure. Your health care provider may also give you more specific  instructions. Your treatment has been planned according to current medical practices, but problems sometimes occur. Call your health care provider if you have any problems or questions after your procedure. What can I expect after the procedure? After the procedure, it is common to have:  Vomiting.  A sore throat.  Mental slowness.  It is common to feel:  Nauseous.  Cold or shivery.  Sleepy.  Tired.  Sore or achy, even in parts of your body where you did not have surgery.  Follow these instructions at home: For at least 24 hours after the procedure:  Do not: ? Participate in activities where you could fall or become injured. ? Drive. ? Use heavy machinery. ? Drink alcohol. ? Take sleeping pills or medicines that cause drowsiness. ? Make important decisions or sign legal documents. ? Take care of children on your own.  Rest. Eating and drinking  If you vomit, drink water, juice, or soup when you can drink without vomiting.  Drink enough fluid to keep your urine clear or pale yellow.  Make sure you have little or no nausea before eating solid foods.  Follow the diet recommended by your health care provider. General instructions  Have a responsible adult stay with you until you are awake and alert.  Return to your normal activities as told by your health care provider. Ask your health care provider what activities are safe for you.  Take over-the-counter and prescription medicines only as told by your health care provider.  If you smoke, do not smoke without supervision.  Keep all follow-up visits as told by your health care provider. This is important. Contact a health care provider if:  You continue to have nausea or vomiting at home, and medicines are not helpful.  You cannot drink fluids or start eating again.  You cannot urinate after 8-12 hours.  You develop a skin rash.  You have fever.  You have increasing redness at the site of your  procedure. Get help right away if:  You have difficulty breathing.  You have chest pain.  You have unexpected bleeding.  You feel that you are having a life-threatening or urgent problem. This information is not intended to replace advice given to you by your health care provider. Make sure you discuss any questions you have with your health care provider. Document Released: 10/21/2000 Document Revised: 12/18/2015 Document Reviewed: 06/29/2015 Elsevier Interactive Patient Education  [employer].

## 2017-08-22 NOTE — Transfer of Care (Signed)
Immediate Anesthesia Transfer of Care Note  Patient: Tyler Farley  Procedure(s) Performed: RADIOACTIVE SEED IMPLANT/BRACHYTHERAPY IMPLANT (N/A ) SPACE OAR INSTILLATION (N/A )  Patient Location: PACU  Anesthesia Type:General  Level of Consciousness: awake and patient cooperative  Airway & Oxygen Therapy: Patient Spontanous Breathing and Patient connected to face mask  Post-op Assessment: vss  Post vital signs: Reviewed and stable  Last Vitals:  Vitals:   08/22/17 0550  BP: (!) 155/76  Pulse: 79  Resp: 18  Temp: 36.8 C  SpO2: 98%    Last Pain:  Vitals:   08/22/17 0550  TempSrc: Oral      Patients Stated Pain Goal: 4 (01/75/10 2585)  Complications: No apparent anesthesia complications

## 2017-08-22 NOTE — Progress Notes (Signed)
Speaking with [employer] via telephone regarding whether stent is MRI compatible.  Pt is able to have MRI's performed on most scanners with the one stent that is placed at this time.  Wife updated with this information and was told that they still need to check prior to any MRIs as to if the certain scanner being used is compatible with his stent.

## 2017-08-22 NOTE — Anesthesia Postprocedure Evaluation (Signed)
Anesthesia Post Note  Patient: Tyler Farley  Procedure(s) Performed: RADIOACTIVE SEED IMPLANT/BRACHYTHERAPY IMPLANT (N/A ) SPACE OAR INSTILLATION (N/A )     Patient location during evaluation: PACU Anesthesia Type: General Level of consciousness: awake and alert Pain management: pain level controlled Vital Signs Assessment: post-procedure vital signs reviewed and stable Respiratory status: spontaneous breathing, nonlabored ventilation, respiratory function stable and patient connected to nasal cannula oxygen Cardiovascular status: blood pressure returned to baseline and stable Postop Assessment: no apparent nausea or vomiting Anesthetic complications: no    Last Vitals:  Vitals:   08/22/17 1015 08/22/17 1030  BP: 139/70 (!) 156/76  Pulse: 78 81  Resp: 18 19  Temp:    SpO2: 100% 90%    Last Pain:  Vitals:   08/22/17 1015  TempSrc:   PainSc: 6                  Barnet Glasgow

## 2017-08-22 NOTE — Op Note (Signed)
PATIENT:  Tyler Farley  PRE-OPERATIVE DIAGNOSIS:  Adenocarcinoma of the prostate  POST-OPERATIVE DIAGNOSIS:  1. Adenocarcinoma the prostate  2. Fossa navicularis urethral stricture.  PROCEDURE:  1. I-125 radioactive seed implantation 2. Cystoscopy  3. Placement of SpaceOAR 4.  Dilation of urethral stricture  SURGEON:  Surgeon(s): Claybon Jabs  Radiation oncologist: Dr. Tyler Pita  ANESTHESIA:  General  EBL:  Minimal  DRAINS: 24 French Foley catheter  INDICATION: Tyler Farley is a 57 year old male who was noted to have an elevated PSA of 10.0.  He underwent TRUS/Bx which revealed adenocarcinoma Gleason 3 + 4 = 7 in 2 cores.  His stage is T1c.  He has elected to proceed with radioactive seed implant for definitive treatment of his prostate cancer.  He does not have any significant voiding symptoms.  Description of procedure: After informed consent the patient was brought to the major OR, placed on the table and administered general anesthesia. He was then moved to the modified lithotomy position with his perineum perpendicular to the floor. His perineum and genitalia were then sterilely prepped. An official timeout was then performed.     Initially a 108 [place] and then a 76 French catheter were attempted to be placed but this was unsuccessful due to what seemed to be a distal urethral stricture.  Examination of the urethra revealed the meatus was actually normal in size.  I therefore calibrated the urethra initially by passing a 12 [place] [employer] sound which I was able to pass and then dilating the fossa navicularis stricture up to 18 [place] with the [employer].  This occurred without bleeding.A 16 French Foley catheter was then placed in the bladder and filled with dilute contrast, a rectal tube was placed in the rectum and the transrectal ultrasound probe was placed in the rectum and affixed to the stand. He was then sterilely draped.  Real time ultrasonography was used  along with the seed planning software [employer] Prostate vs. 4.2.2.4. This was used to develop the seed plan including the number of needles as well as number of seeds required for complete and adequate coverage. Real-time ultrasonography was then used along with the previously developed plan and the Nucletron device to implant a total of 63 seeds using 17 needles. This proceeded without difficulty or complication.   I then proceeded with placement of SpaceOARby introducing a needle with the bevel angled inferiorly approximately 2 cm superior to the anus. This was angled downward and under direct ultrasound was placed within the space between the prostatic capsule and rectum. This was confirmed with a small amount of sterile saline injected and this was performed under direct ultrasound. I then attached the SpaceOARto the needle and injected this in the space between the prostate and rectum with good placement noted.  A Foley catheter was then removed as well as the transrectal ultrasound probe and rectal probe. Flexible cystoscopy was then performed using the 17 French flexible scope which revealed a normal urethra throughout its length down to the sphincter which appeared intact. The prostatic urethra revealed bilobar hypertrophy but no evidence of obstruction, seeds, spacers or lesions. The bladder was then entered and fully and systematically inspected. The ureteral orifices were noted to be of normal configuration and position. The mucosa revealed no evidence of tumors. There were also no stones identified within the bladder. I noted no seeds or spacers on the floor of the bladder and retroflexion of the scope revealed no seeds protruding from the  base of the prostate.  The cystoscope was then removed and the patient was awakened and taken to recovery room in stable and satisfactory condition. He tolerated procedure well and there were no intraoperative complications.

## 2017-08-22 NOTE — Progress Notes (Signed)
  Radiation Oncology         [phone] ________________________________  Name: Tyler Farley MRN: [id number]  Date: 08/22/2017  DOB: 1961/01/06       Prostate Seed Implant  [id number], [name], MD  No ref. provider found  DIAGNOSIS: 57 y.o. gentleman with Stage T1c adenocarcinoma of the prostate with Gleason Score of 4+3, and PSA of 10.0    ICD-10-CM   1. Malignant neoplasm of prostate [facility]) [place] Discharge patient    PROCEDURE: Insertion of radioactive I-125 seeds into the prostate gland.  RADIATION DOSE: 145 Gy, definitive therapy.  TECHNIQUE: Tyler Farley was brought to the operating room with the urologist. He was placed in the dorsolithotomy position. He was catheterized and a rectal tube was inserted. The perineum was shaved, prepped and draped. The ultrasound probe was then introduced into the rectum to see the prostate gland.  TREATMENT DEVICE: A needle grid was attached to the ultrasound probe stand and anchor needles were placed.  3D PLANNING: The prostate was imaged in 3D using a sagittal sweep of the prostate probe. These images were transferred to the planning computer. There, the prostate, urethra and rectum were defined on each axial reconstructed image. Then, the software created an optimized 3D plan and a few seed positions were adjusted. The quality of the plan was reviewed using [facility] information for the target and the following two organs at risk:  Urethra and Rectum.  Then the accepted plan was uploaded to the seed [employer] afterloading unit.  PROSTATE VOLUME STUDY:  Using transrectal ultrasound the volume of the prostate was verified to be 24 cc.  SPECIAL TREATMENT PROCEDURE/SUPERVISION AND HANDLING: The Nucletron FIRST system was used to place the needles under sagittal guidance. A total of 21 needles were used to deposit 63 seeds in the prostate gland. The individual seed activity was 0.331 mCi.  SpaceOAR:  Yes  COMPLEX SIMULATION: At the end of the procedure, an  anterior radiograph of the pelvis was obtained to document seed positioning and count. Cystoscopy was performed to check the urethra and bladder.  MICRODOSIMETRY: At the end of the procedure, the patient was emitting 0.050 mR/hr at 1 meter. Accordingly, he was considered safe for hospital discharge.  PLAN: The patient will return to the radiation oncology clinic for post implant CT dosimetry in three weeks.   ________________________________  Sheral Apley Tammi Klippel, M.D.

## 2017-08-25 ENCOUNTER — Encounter (HOSPITAL_COMMUNITY): Payer: Self-pay | Admitting: Urology

## 2017-09-03 ENCOUNTER — Telehealth: Payer: Self-pay | Admitting: *Deleted

## 2017-09-03 NOTE — Telephone Encounter (Signed)
Called patient to remind of post seed appts. for 09-04-17 and his MRI on 09-04-17, spoke with patient and he is aware of these appts.

## 2017-09-04 ENCOUNTER — Encounter: Payer: Self-pay | Admitting: Medical Oncology

## 2017-09-04 ENCOUNTER — Ambulatory Visit
Admission: RE | Admit: 2017-09-04 | Discharge: 2017-09-04 | Disposition: A | Discharge status: Home / Self Care | Payer: Medicare PPO | Source: Ambulatory Visit | Attending: Radiation Oncology | Admitting: Radiation Oncology

## 2017-09-04 ENCOUNTER — Other Ambulatory Visit: Payer: Self-pay

## 2017-09-04 ENCOUNTER — Ambulatory Visit (HOSPITAL_COMMUNITY)
Admission: RE | Admit: 2017-09-04 | Discharge: 2017-09-04 | Disposition: A | Discharge status: Home / Self Care | Payer: Medicare PPO | Source: Ambulatory Visit | Attending: Urology | Admitting: Urology

## 2017-09-04 ENCOUNTER — Encounter: Payer: Self-pay | Admitting: Radiation Oncology

## 2017-09-04 VITALS — BP 109/71 | HR 75 | Temp 98.0°F | Resp 18 | Wt 153.0 lb

## 2017-09-04 DIAGNOSIS — R3911 Hesitancy of micturition: Secondary | ICD-10-CM | POA: Insufficient documentation

## 2017-09-04 DIAGNOSIS — C61 Malignant neoplasm of prostate: Secondary | ICD-10-CM | POA: Diagnosis not present

## 2017-09-04 DIAGNOSIS — Z794 Long term (current) use of insulin: Secondary | ICD-10-CM | POA: Insufficient documentation

## 2017-09-04 DIAGNOSIS — Z7982 Long term (current) use of aspirin: Secondary | ICD-10-CM | POA: Insufficient documentation

## 2017-09-04 DIAGNOSIS — Z510 Encounter for antineoplastic radiation therapy: Secondary | ICD-10-CM | POA: Diagnosis not present

## 2017-09-04 DIAGNOSIS — Z79899 Other long term (current) drug therapy: Secondary | ICD-10-CM | POA: Diagnosis not present

## 2017-09-04 NOTE — Progress Notes (Signed)
Patient returns today for post seed follow up with Dr. Tammi Klippel. Patient's pre seed IPSS score was 10 but post seed IPSS score is much improved at 5. Patient scheduled to see Dr. Junious Silk, urologist, next Friday for follow up. Scheduled for MRI today at 1645 to confirm SpaceOar placement. Denies dysuria, hematuria, leakage or incontinence.   BP 109/71 (BP Location: Right Arm, Patient Position: Sitting, Cuff Size: Normal)   Pulse 75   Temp 98 F (36.7 C) (Oral)   Resp 18   Wt 153 lb (69.4 kg)   SpO2 100%   BMI 24.69 kg/m  Wt Readings from Last 3 Encounters:  09/04/17 153 lb (69.4 kg)  08/22/17 156 lb (70.8 kg)  08/15/17 156 lb (70.8 kg)

## 2017-09-04 NOTE — Progress Notes (Signed)
Tyler Farley states he is doing very well. He has minimal side effects and is very pleased. He asked if he would need to continue the Lupron. We will address this with Dr. Tammi Klippel.  He started Lupron because he had to delay treatment due to a stroke. He has follow up with Dr. Karsten Ro next week. I stressed the importance of keeping his follow up appointments to monitor his PSA. He voiced understanding and I asked them to call if I can be of further assistance in the future. I reminded them of our prostate support group and told them we would be happy to have them attend.

## 2017-09-04 NOTE — Progress Notes (Signed)
[place]         [phone] ________________________________  Name: CONN TROMBETTA MRN: [id number]  Date: 09/04/2017  DOB: 05-16-1961  COMPLEX SIMULATION NOTE  NARRATIVE:  The patient was brought to the [facility] today following prostate seed implantation approximately one month ago.  Identity was confirmed.  All relevant records and images related to the planned course of therapy were reviewed.  Then, the patient was set-up supine.  CT images were obtained.  The CT images were loaded into the planning software.  Then the prostate and rectum were contoured.  Treatment planning then occurred.  The implanted iodine 125 seeds were identified by the physics staff for projection of radiation distribution  I have requested : 3D Simulation  I have requested a DVH of the following structures: Prostate and rectum.    ________________________________  Sheral Apley Tammi Klippel, M.D.  This document serves as a record of services personally performed by Tyler Pita, MD. It was created on his behalf by Rae Lips, a trained medical scribe. The creation of this record is based on the scribe's personal observations and the provider's statements to them. This document has been checked and approved by the attending provider.

## 2017-09-04 NOTE — Progress Notes (Signed)
Radiation Oncology         [phone] ________________________________  Name: Tyler Farley MRN: [id number]  Date: 09/04/2017  DOB: 04-29-1961  Post-Seed Follow-Up Visit Note  CC: Tyler Aus, MD  Kathie Rhodes, MD  Diagnosis:   57 y.o. gentleman with Stage T1cadenocarcinoma of the prostate with Gleason Score of 4+3, and PSA of10.0   No diagnosis found.  Interval Since Last Radiation:  2 weeks - Insertion of radioactive I-125 seeds into the prostate gland, 145 Gy definitive therapy, SpaceOAR placement  Narrative:  The patient returns today for routine follow-up.  He was started on ADT in 02/2017 due to prolonged wait time for starting treatment due to the need for blood thinners post recent cardiac stent placement.  He has tolerated the ADT well overall.  He is complaining of increased urinary frequency and urinary hesitation symptoms. He filled out a questionnaire regarding urinary function today providing and overall IPSS score of 5 characterizing his symptoms as mild.  His pre-implant score was 10. He denies any bowel symptoms.  ALLERGIES:  is allergic to atorvastatin.  Meds: Current Outpatient Medications  Medication Sig Dispense Refill  . Alpha-Lipoic Acid 200 MG CAPS Take 400 mg by mouth 2 (two) times daily.     . Amino Acids (L-CARNITINE PO) Take 800 mg by mouth 2 (two) times daily.    Marland Kitchen arginine 500 MG tablet Take 500 mg by mouth 2 (two) times daily.    Marland Kitchen aspirin 81 MG tablet Take 81 mg by mouth daily.     Marland Kitchen atorvastatin (LIPITOR) 20 MG tablet Take 20 mg by mouth daily.     . B Complex Vitamins (VITAMIN-B COMPLEX) TABS Take 1 tablet by mouth daily.     . baclofen (LIORESAL) 10 MG tablet Take 10 mg by mouth at bedtime.     . carvedilol (COREG) 25 MG tablet Take 25 mg by mouth 2 (two) times daily with a meal.     . Cholecalciferol (VITAMIN D3) 1000 units CAPS Take 1,000 Units by mouth daily.    . ciprofloxacin (CIPRO) 500 MG tablet Take 1 tablet (500 mg total) by mouth 2  (two) times daily. 6 tablet 0  . clopidogrel (PLAVIX) 75 MG tablet Take 75 mg by mouth daily.    . Coenzyme Q10 100 MG capsule Take 100 mg by mouth daily.     Marland Kitchen ezetimibe (ZETIA) 10 MG tablet Take 10 mg by mouth daily.    . ferrous sulfate 325 (65 FE) MG tablet Take 325 mg by mouth daily with breakfast.    . hydrochlorothiazide (HYDRODIURIL) 12.5 MG tablet Take 12.5 mg by mouth daily.     Marland Kitchen HYDROcodone-acetaminophen (NORCO) 10-325 MG tablet Take 1-2 tablets by mouth every 4 (four) hours as needed for moderate pain. Maximum dose per 24 hours - 8 pills 8 tablet 0  . LANTUS SOLOSTAR 100 UNIT/ML Solostar Pen Inject 20 Units into the skin at bedtime.     Marland Kitchen losartan (COZAAR) 100 MG tablet Take 100 mg by mouth at bedtime.     . Misc Natural Products (BETA-SITOSTEROL PLANT STEROLS PO) Take 1 capsule by mouth at bedtime.    . nitroGLYCERIN (NITROSTAT) 0.4 MG SL tablet Place 0.4 mg under the tongue every 5 (five) minutes as needed for chest pain.    Marland Kitchen NOVOLOG FLEXPEN 100 UNIT/ML FlexPen Inject 5-12 Units into the skin 3 (three) times daily with meals. Per sliding scale    . promethazine (PHENERGAN) 25 MG tablet  Take 25 mg by mouth every 6 (six) hours as needed for nausea or vomiting.    . Turmeric 500 MG CAPS Take 500 mg by mouth 2 (two) times daily.     No current facility-administered medications for this encounter.     Physical Findings: In general this is a well appearing Caucasian male in no acute distress.  He's alert and oriented x4 and appropriate throughout the examination. Cardiopulmonary assessment is negative for acute distress and he exhibits normal effort.   Lab Findings: Lab Results  Component Value Date   WBC 4.7 08/15/2017   HGB 11.6 (L) 08/15/2017   HCT 35.1 (L) 08/15/2017   MCV 93.6 08/15/2017   PLT 263 08/15/2017    Radiographic Findings:  The patient underwent CT imaging in our clinic for post implant dosimetry. The CT was reviewed by Dr. Tammi Klippel and appears to demonstrate  an adequate distribution of radioactive seeds throughout the prostate gland. There are no seeds in or near the rectum. We suspect the final radiation plan and dosimetry will show appropriate coverage of the prostate gland.   Impression/Plan: The patient is recovering from the effects of radiation. His urinary symptoms should gradually improve over the next 4-6 months. We talked about this today. He is encouraged by his improvement already and is otherwise pleased with his outcome. We also talked about long-term follow-up for prostate cancer following seed implant. He understands that ongoing PSA determinations and digital rectal exams will help perform surveillance to rule out disease recurrence. He has a follow up appointment scheduled with Dr. Karsten Ro on Friday, Feb. 15th. He understands what to expect with his PSA measures. Patient was also educated today about some of the long-term effects from radiation including a small risk for rectal bleeding and possibly erectile dysfunction. We talked about some of the general management approaches to these potential complications. However, I did encourage the patient to contact our office or return at any point if he has questions or concerns related to his previous radiation and prostate cancer.    Nicholos Johns, PA-C    Tyler Pita, MD  [facility] Direct Dial: [phone]  Fax: [phone] [email or web address]  Skype  LinkedIn    Page Me   This document serves as a record of services personally performed by Tyler Pita, MD and Freeman Caldron, PA-C. It was created on their behalf by Rae Lips, a trained medical scribe. The creation of this record is based on the scribe's personal observations and the providers' statements to them. This document has been checked and approved by the attending providers.

## 2017-09-24 ENCOUNTER — Encounter: Payer: Self-pay | Admitting: Radiation Oncology

## 2017-09-24 DIAGNOSIS — Z510 Encounter for antineoplastic radiation therapy: Secondary | ICD-10-CM | POA: Diagnosis not present

## 2017-10-05 NOTE — Progress Notes (Signed)
  Radiation Oncology         [phone] ________________________________  Name: NORWOOD QUEZADA MRN: [id number]  Date: 09/24/2017  DOB: [birth year]  3D Planning Note   Prostate Brachytherapy Post-Implant Dosimetry  Diagnosis: 57 y.o. gentleman with Stage T1c adenocarcinoma of the prostate with Gleason Score of 4+3, and PSA of 10.0  Narrative: On a previous date, MAKSIM PEREGOY returned following prostate seed implantation for post implant planning. He underwent CT scan complex simulation to delineate the three-dimensional structures of the pelvis and demonstrate the radiation distribution.  Since that time, the seed localization, and complex isodose planning with dose volume histograms have now been completed.  Results:   Prostate Coverage - The dose of radiation delivered to the 90% or more of the prostate gland (D90) was 93.14% of the prescription dose. This exceeds our goal of greater than 90%. Rectal Sparing - The volume of rectal tissue receiving the prescription dose or higher was 0.0 cc. This falls under our thresholds tolerance of 1.0 cc.  Impression: The prostate seed implant appears to show adequate target coverage and appropriate rectal sparing.  Plan:  The patient will continue to follow with urology for ongoing PSA determinations. I would anticipate a high likelihood for local tumor control with minimal risk for rectal morbidity.  ________________________________  Sheral Apley Tammi Klippel, M.D.

## 2017-10-24 DIAGNOSIS — D369 Benign neoplasm, unspecified site: Secondary | ICD-10-CM | POA: Insufficient documentation

## 2017-12-30 DIAGNOSIS — D369 Benign neoplasm, unspecified site: Secondary | ICD-10-CM | POA: Insufficient documentation

## 2018-03-03 ENCOUNTER — Encounter: Payer: Self-pay | Admitting: *Deleted

## 2018-03-04 ENCOUNTER — Ambulatory Visit: Payer: Medicare PPO | Admitting: Certified Registered Nurse Anesthetist

## 2018-03-04 ENCOUNTER — Encounter
Admission: RE | Disposition: A | Discharge status: Home / Self Care | Payer: Self-pay | Source: Ambulatory Visit | Attending: Internal Medicine

## 2018-03-04 ENCOUNTER — Encounter: Payer: Self-pay | Admitting: Internal Medicine

## 2018-03-04 ENCOUNTER — Ambulatory Visit
Admission: RE | Admit: 2018-03-04 | Discharge: 2018-03-04 | Disposition: A | Discharge status: Home / Self Care | Payer: Medicare PPO | Source: Ambulatory Visit | Attending: Internal Medicine | Admitting: Internal Medicine

## 2018-03-04 DIAGNOSIS — Z95820 Peripheral vascular angioplasty status with implants and grafts: Secondary | ICD-10-CM | POA: Insufficient documentation

## 2018-03-04 DIAGNOSIS — E1151 Type 2 diabetes mellitus with diabetic peripheral angiopathy without gangrene: Secondary | ICD-10-CM | POA: Diagnosis not present

## 2018-03-04 DIAGNOSIS — Z79899 Other long term (current) drug therapy: Secondary | ICD-10-CM | POA: Diagnosis not present

## 2018-03-04 DIAGNOSIS — Z794 Long term (current) use of insulin: Secondary | ICD-10-CM | POA: Insufficient documentation

## 2018-03-04 DIAGNOSIS — K640 First degree hemorrhoids: Secondary | ICD-10-CM | POA: Insufficient documentation

## 2018-03-04 DIAGNOSIS — E113599 Type 2 diabetes mellitus with proliferative diabetic retinopathy without macular edema, unspecified eye: Secondary | ICD-10-CM | POA: Insufficient documentation

## 2018-03-04 DIAGNOSIS — I252 Old myocardial infarction: Secondary | ICD-10-CM | POA: Diagnosis not present

## 2018-03-04 DIAGNOSIS — G4730 Sleep apnea, unspecified: Secondary | ICD-10-CM | POA: Insufficient documentation

## 2018-03-04 DIAGNOSIS — Z86010 Personal history of colonic polyps: Secondary | ICD-10-CM | POA: Diagnosis present

## 2018-03-04 DIAGNOSIS — Z7982 Long term (current) use of aspirin: Secondary | ICD-10-CM | POA: Diagnosis not present

## 2018-03-04 DIAGNOSIS — H409 Unspecified glaucoma: Secondary | ICD-10-CM | POA: Diagnosis not present

## 2018-03-04 DIAGNOSIS — Z888 Allergy status to other drugs, medicaments and biological substances status: Secondary | ICD-10-CM | POA: Insufficient documentation

## 2018-03-04 DIAGNOSIS — I2510 Atherosclerotic heart disease of native coronary artery without angina pectoris: Secondary | ICD-10-CM | POA: Insufficient documentation

## 2018-03-04 DIAGNOSIS — Z8673 Personal history of transient ischemic attack (TIA), and cerebral infarction without residual deficits: Secondary | ICD-10-CM | POA: Diagnosis not present

## 2018-03-04 DIAGNOSIS — I10 Essential (primary) hypertension: Secondary | ICD-10-CM | POA: Insufficient documentation

## 2018-03-04 DIAGNOSIS — Z1211 Encounter for screening for malignant neoplasm of colon: Secondary | ICD-10-CM | POA: Insufficient documentation

## 2018-03-04 DIAGNOSIS — Z7902 Long term (current) use of antithrombotics/antiplatelets: Secondary | ICD-10-CM | POA: Diagnosis not present

## 2018-03-04 DIAGNOSIS — Z859 Personal history of malignant neoplasm, unspecified: Secondary | ICD-10-CM | POA: Insufficient documentation

## 2018-03-04 HISTORY — DX: Personal history of urinary calculi: Z87.442

## 2018-03-04 HISTORY — DX: Non-pressure chronic ulcer of other part of unspecified foot with unspecified severity: L97.509

## 2018-03-04 HISTORY — PX: COLONOSCOPY WITH PROPOFOL: SHX5780

## 2018-03-04 HISTORY — DX: Pneumonia, unspecified organism: J18.9

## 2018-03-04 HISTORY — DX: Atherosclerotic heart disease of native coronary artery without angina pectoris: I25.10

## 2018-03-04 HISTORY — DX: Acute myocardial infarction, unspecified: I21.9

## 2018-03-04 HISTORY — DX: Peripheral vascular disease, unspecified: I73.9

## 2018-03-04 HISTORY — DX: Other non-diabetic proliferative retinopathy, unspecified eye: H35.20

## 2018-03-04 HISTORY — DX: Personal history of colonic polyps: Z86.010

## 2018-03-04 HISTORY — DX: Male erectile dysfunction, unspecified: N52.9

## 2018-03-04 LAB — GLUCOSE, CAPILLARY: Glucose-Capillary: 161 mg/dL — ABNORMAL HIGH (ref 70–99)

## 2018-03-04 SURGERY — COLONOSCOPY WITH PROPOFOL
Anesthesia: General

## 2018-03-04 MED ORDER — PROPOFOL 500 MG/50ML IV EMUL
INTRAVENOUS | Status: DC | PRN
  Administered 2018-03-04: 150 ug/kg/min via INTRAVENOUS

## 2018-03-04 MED ORDER — LIDOCAINE HCL (PF) 2 % IJ SOLN
INTRAMUSCULAR | Status: AC
  Filled 2018-03-04: qty 40

## 2018-03-04 MED ORDER — SODIUM CHLORIDE 0.9 % IV SOLN
INTRAVENOUS | Status: DC
  Administered 2018-03-04: 1000 mL via INTRAVENOUS

## 2018-03-04 MED ORDER — PROPOFOL 10 MG/ML IV BOLUS
INTRAVENOUS | Status: DC | PRN
  Administered 2018-03-04: 20 mg via INTRAVENOUS
  Administered 2018-03-04: 70 mg via INTRAVENOUS

## 2018-03-04 MED ORDER — PROPOFOL 500 MG/50ML IV EMUL
INTRAVENOUS | Status: AC
  Filled 2018-03-04: qty 200

## 2018-03-04 NOTE — Anesthesia Procedure Notes (Signed)
Performed by: Kamera Dubas, CRNA Pre-anesthesia Checklist: Patient identified, Emergency Drugs available, Suction available, Patient being monitored and Timeout performed Patient Re-evaluated:Patient Re-evaluated prior to induction Oxygen Delivery Method: Nasal cannula Induction Type: IV induction       

## 2018-03-04 NOTE — Interval H&P Note (Signed)
History and Physical Interval Note:  03/04/2018 8:02 AM  Tyler Farley  has presented today for surgery, with the diagnosis of Tubulovillous adenomatous polyp  The various methods of treatment have been discussed with the patient and family. After consideration of risks, benefits and other options for treatment, the patient has consented to  Procedure(s): COLONOSCOPY WITH PROPOFOL (N/A) as a surgical intervention .  The patient's history has been reviewed, patient examined, no change in status, stable for surgery.  I have reviewed the patient's chart and labs.  Questions were answered to the patient's satisfaction.     Adrian, [place]

## 2018-03-04 NOTE — Anesthesia Post-op Follow-up Note (Signed)
Anesthesia QCDR form completed.        

## 2018-03-04 NOTE — Anesthesia Preprocedure Evaluation (Signed)
Anesthesia Evaluation  Patient identified by MRN, date of birth, ID band Patient awake    Reviewed: Allergy & Precautions, H&P , NPO status , reviewed documented beta blocker date and time   Airway Mallampati: II  TM Distance: >3 FB Neck ROM: full    Dental  (+) Teeth Intact   Pulmonary sleep apnea , pneumonia, resolved,    Pulmonary exam normal        Cardiovascular hypertension, + CAD, + Past MI and + Peripheral Vascular Disease  Normal cardiovascular exam     Neuro/Psych CVA, No Residual Symptoms    GI/Hepatic neg GERD  ,  Endo/Other  diabetes  Renal/GU Renal disease     Musculoskeletal   Abdominal   Peds  Hematology   Anesthesia Other Findings Past Medical History: No date: Coronary artery disease No date: Diabetes ([place]) No date: ED (erectile dysfunction) No date: Glaucoma No date: History of colonic polyps No date: History of kidney stones No date: Hyperlipemia No date: Hypertension No date: Ischemic toe ulcer (HCC) No date: Myocardial infarction ([place]) No date: Peripheral vascular disease (HCC) No date: Pneumonia 2018: Prostate cancer ([place]) No date: Retinopathy, background, proliferative No date: Sleep apnea No date: Stroke [facility])  Past Surgical History: No date: BRONCHOSCOPY No date: EYE SURGERY 2013, 2014: FEMORAL-POPLITEAL BYPASS GRAFT; Bilateral 12/10/2016: PROSTATE BIOPSY 08/22/2017: RADIOACTIVE SEED IMPLANT; N/A     Comment:  Procedure: RADIOACTIVE SEED IMPLANT/BRACHYTHERAPY               IMPLANT;  Surgeon: Kathie Rhodes, MD;  Location: [facility]S;               Service: Urology;  Laterality: N/A; No date: REFRACTIVE SURGERY     Comment:  Right 2017, Left 2007 08/22/2017: SPACE OAR INSTILLATION; N/A     Comment:  Procedure: SPACE OAR INSTILLATION;  Surgeon: Kathie Rhodes, MD;  Location: [facility]S;  Service: Urology;                Laterality: N/A;  BMI    Body Mass Index:  25.50  kg/m      Reproductive/Obstetrics                             Anesthesia Physical Anesthesia Plan  ASA: III  Anesthesia Plan: General   Post-op Pain Management:    Induction: Intravenous  PONV Risk Score and Plan: 2 and Treatment may vary due to age or medical condition and TIVA  Airway Management Planned: Nasal Cannula and Natural Airway  Additional Equipment:   Intra-op Plan:   Post-operative Plan:   Informed Consent: I have reviewed the patients History and Physical, chart, labs and discussed the procedure including the risks, benefits and alternatives for the proposed anesthesia with the patient or authorized representative who has indicated his/her understanding and acceptance.   Dental Advisory Given  Plan Discussed with: CRNA  Anesthesia Plan Comments:         Anesthesia Quick Evaluation

## 2018-03-04 NOTE — Op Note (Signed)
[facility] Patient Name: Tyler Farley Procedure Date: 03/04/2018 7:22 AM MRN: [id number] Account #: [id number] Date of Birth: [birth year] Admit Type: Outpatient Age: 57 Room: [facility] ENDO ROOM 4 Gender: Male Note Status: Finalized Procedure:            Colonoscopy Indications:          High risk colon cancer surveillance: Personal history                        of adenoma with villous component Providers:            Benay Pike. Alice Reichert MD, MD Referring MD:         [name], MD (Referring MD) Medicines:            Propofol per Anesthesia Complications:        No immediate complications. Procedure:            Pre-Anesthesia Assessment:                       - The risks and benefits of the procedure and the                        sedation options and risks were discussed with the                        patient. All questions were answered and informed                        consent was obtained.                       - Patient identification and proposed procedure were                        verified prior to the procedure by the nurse. The                        procedure was verified in the procedure room.                       - ASA Grade Assessment: III - A patient with severe                        systemic disease.                       - After reviewing the risks and benefits, the patient                        was deemed in satisfactory condition to undergo the                        procedure.                       After obtaining informed consent, the colonoscope was                        passed under direct vision. Throughout the procedure,  the patient's blood pressure, pulse, and oxygen                        saturations were monitored continuously. The                        Colonoscope was introduced through the anus and                        advanced to the the cecum, identified by appendiceal         orifice and ileocecal valve. The colonoscopy was                        performed without difficulty. The patient tolerated the                        procedure well. The quality of the bowel preparation                        was adequate. The ileocecal valve, appendiceal orifice,                        and rectum were photographed. Findings:      The perianal and digital rectal examinations were normal. Pertinent       negatives include normal sphincter tone and no palpable rectal lesions.      The colon (entire examined portion) appeared normal.      Non-bleeding internal hemorrhoids were found during retroflexion. The       hemorrhoids were Grade I (internal hemorrhoids that do not prolapse).      The exam was otherwise without abnormality. Impression:           - The entire examined colon is normal.                       - Non-bleeding internal hemorrhoids.                       - The examination was otherwise normal.                       - No specimens collected. Recommendation:       - Patient has a contact number available for                        emergencies. The signs and symptoms of potential                        delayed complications were discussed with the patient.                        Return to normal activities tomorrow. Written discharge                        instructions were provided to the patient.                       - Resume previous diet.                       - Continue present medications.                       -  Repeat colonoscopy in 5 years for surveillance.                       - The findings and recommendations were discussed with                        the patient and their spouse. Procedure Code(s):    --- Professional ---                       K2409, Colorectal cancer screening; colonoscopy on                        individual at high risk Diagnosis Code(s):    --- Professional ---                       K64.0, First degree hemorrhoids                        Z86.010, Personal history of colonic polyps CPT copyright 2017 American Medical Association. All rights reserved. The codes documented in this report are preliminary and upon coder review may  be revised to meet current compliance requirements. Efrain Sella MD, MD 03/04/2018 8:23:29 AM This report has been signed electronically. Number of Addenda: 0 Note Initiated On: 03/04/2018 7:22 AM Scope Withdrawal Time: 0 hours 6 minutes 55 seconds  Total Procedure Duration: 0 hours 11 minutes 5 seconds       [facility]

## 2018-03-04 NOTE — H&P (Signed)
Outpatient short stay form Pre-procedure 03/04/2018 8:01 AM Tyler Farley K. Alice Reichert, M.D.  Primary Physician: Emily Filbert, M.D.  Reason for visit:  Personal hx of tubulovillous adenoma of the colon  History of present illness:  Patient presents for colonoscopy for colon polyp surveillance. The patient denies complaints of abdominal pain, significant change in bowel habits, or rectal bleeding.     Current Facility-Administered Medications:  .  0.9 %  sodium chloride infusion, , Intravenous, Continuous, Martin, Benay Pike, MD, Last Rate: 20 mL/hr at 03/04/18 0726, 1,000 mL at 03/04/18 0726  Medications Prior to Admission  Medication Sig Dispense Refill Last Dose  . Alpha-Lipoic Acid 200 MG CAPS Take 400 mg by mouth 2 (two) times daily.    Past Week at Unknown time  . Amino Acids (L-CARNITINE PO) Take 800 mg by mouth 2 (two) times daily.   Past Week at Unknown time  . arginine 500 MG tablet Take 500 mg by mouth 2 (two) times daily.   Past Week at Unknown time  . aspirin 81 MG tablet Take 81 mg by mouth daily.    03/03/2018 at Unknown time  . atorvastatin (LIPITOR) 20 MG tablet Take 20 mg by mouth daily.    03/03/2018 at Unknown time  . B Complex Vitamins (VITAMIN-B COMPLEX) TABS Take 1 tablet by mouth daily.    Past Week at Unknown time  . baclofen (LIORESAL) 10 MG tablet Take 10 mg by mouth at bedtime.    Past Week at Unknown time  . carvedilol (COREG) 25 MG tablet Take 25 mg by mouth 2 (two) times daily with a meal.    03/04/2018 at 0630  . Cholecalciferol (VITAMIN D3) 1000 units CAPS Take 1,000 Units by mouth daily.   Past Week at Unknown time  . clopidogrel (PLAVIX) 75 MG tablet Take 75 mg by mouth daily.   Past Week at Unknown time  . Coenzyme Q10 100 MG capsule Take 100 mg by mouth daily.    Past Week at Unknown time  . ezetimibe (ZETIA) 10 MG tablet Take 10 mg by mouth daily.   03/03/2018 at Unknown time  . ferrous sulfate 325 (65 FE) MG tablet Take 325 mg by mouth daily with breakfast.   Past Week  at Unknown time  . hydrochlorothiazide (HYDRODIURIL) 12.5 MG tablet Take 12.5 mg by mouth daily.    03/03/2018 at Unknown time  . HYDROcodone-acetaminophen (NORCO) 10-325 MG tablet Take 1-2 tablets by mouth every 4 (four) hours as needed for moderate pain. Maximum dose per 24 hours - 8 pills 8 tablet 0 Past Week at Unknown time  . LANTUS SOLOSTAR 100 UNIT/ML Solostar Pen Inject 20 Units into the skin at bedtime.    Past Week at Unknown time  . losartan (COZAAR) 100 MG tablet Take 100 mg by mouth at bedtime.    03/03/2018 at Unknown time  . Misc Natural Products (BETA-SITOSTEROL PLANT STEROLS PO) Take 1 capsule by mouth at bedtime.   Past Week at Unknown time  . nitroGLYCERIN (NITROSTAT) 0.4 MG SL tablet Place 0.4 mg under the tongue every 5 (five) minutes as needed for chest pain.   Past Week at Unknown time  . NOVOLOG FLEXPEN 100 UNIT/ML FlexPen Inject 5-12 Units into the skin 3 (three) times daily with meals. Per sliding scale   Past Week at Unknown time  . promethazine (PHENERGAN) 25 MG tablet Take 25 mg by mouth every 6 (six) hours as needed for nausea or vomiting.   Past Week at  Unknown time  . Turmeric 500 MG CAPS Take 500 mg by mouth 2 (two) times daily.   Past Week at Unknown time  . ciprofloxacin (CIPRO) 500 MG tablet Take 1 tablet (500 mg total) by mouth 2 (two) times daily. (Patient not taking: Reported on 03/04/2018) 6 tablet 0 Not Taking at Unknown time     Allergies  Allergen Reactions  . Atorvastatin Other (See Comments)    Elevated LFTs on high doses, wife says pt is back on a low dose, tolerating well.     Past Medical History:  Diagnosis Date  . Coronary artery disease   . Diabetes ([place])   . ED (erectile dysfunction)   . Glaucoma   . History of colonic polyps   . History of kidney stones   . Hyperlipemia   . Hypertension   . Ischemic toe ulcer ([place])   . Myocardial infarction ([place])   . Peripheral vascular disease ([place])   . Pneumonia   . Prostate cancer ([place]) 2018  .  Retinopathy, background, proliferative   . Sleep apnea   . Stroke [facility])     Review of systems:  Otherwise negative.    Physical Exam  Gen: Alert, oriented. Appears stated age.  HEENT: [employer]/AT. PERRLA. Lungs: CTA, no wheezes. CV: RR nl S1, S2. Abd: soft, benign, no masses. BS+ Ext: No edema. Pulses 2+    Planned procedures: Proceed with colonoscopy. The patient understands the nature of the planned procedure, indications, risks, alternatives and potential complications including but not limited to bleeding, infection, perforation, damage to internal organs and possible oversedation/side effects from anesthesia. The patient agrees and gives consent to proceed.  Please refer to procedure notes for findings, recommendations and patient disposition/instructions.     Cartez Mogle K. Alice Reichert, M.D. Gastroenterology 03/04/2018  8:01 AM

## 2018-03-04 NOTE — Transfer of Care (Signed)
Immediate Anesthesia Transfer of Care Note  Patient: Tyler Farley  Procedure(s) Performed: COLONOSCOPY WITH PROPOFOL (N/A )  Patient Location: PACU  Anesthesia Type:General  Level of Consciousness: awake, alert  and oriented  Airway & Oxygen Therapy: Patient Spontanous Breathing and Patient connected to nasal cannula oxygen  Post-op Assessment: Report given to RN and Post -op Vital signs reviewed and stable  Post vital signs: Reviewed and stable  Last Vitals:  Vitals Value Taken Time  BP 91/48 03/04/2018  8:27 AM  Temp 36.4 C 03/04/2018  8:27 AM  Pulse 78 03/04/2018  8:27 AM  Resp 16 03/04/2018  8:27 AM  SpO2 99 % 03/04/2018  8:27 AM  Vitals shown include unvalidated device data.  Last Pain:  Vitals:   03/04/18 0827  TempSrc: Tympanic  PainSc: 0-No pain         Complications: No apparent anesthesia complications

## 2018-03-04 NOTE — Anesthesia Postprocedure Evaluation (Signed)
Anesthesia Post Note  Patient: Tyler Farley  Procedure(s) Performed: COLONOSCOPY WITH PROPOFOL (N/A )  Patient location during evaluation: Endoscopy Anesthesia Type: General Level of consciousness: awake and alert Pain management: pain level controlled Vital Signs Assessment: post-procedure vital signs reviewed and stable Respiratory status: spontaneous breathing, nonlabored ventilation and respiratory function stable Cardiovascular status: blood pressure returned to baseline and stable Postop Assessment: no apparent nausea or vomiting Anesthetic complications: no     Last Vitals:  Vitals:   03/04/18 0837 03/04/18 0847  BP: 106/70 123/70  Pulse: 76 70  Resp: 19 19  Temp:    SpO2: 98% 98%    Last Pain:  Vitals:   03/04/18 0847  TempSrc:   PainSc: 0-No pain                 Alphonsus Sias

## 2018-05-14 DIAGNOSIS — I6522 Occlusion and stenosis of left carotid artery: Secondary | ICD-10-CM | POA: Insufficient documentation

## 2019-06-17 DIAGNOSIS — G8104 Flaccid hemiplegia affecting left nondominant side: Secondary | ICD-10-CM | POA: Insufficient documentation

## 2019-06-17 DIAGNOSIS — Z0000 Encounter for general adult medical examination without abnormal findings: Secondary | ICD-10-CM | POA: Insufficient documentation

## 2020-06-19 DIAGNOSIS — I209 Angina pectoris, unspecified: Secondary | ICD-10-CM | POA: Insufficient documentation

## 2021-08-13 ENCOUNTER — Other Ambulatory Visit (HOSPITAL_COMMUNITY): Payer: Self-pay | Admitting: Urology

## 2021-08-13 DIAGNOSIS — C61 Malignant neoplasm of prostate: Secondary | ICD-10-CM

## 2021-08-17 ENCOUNTER — Other Ambulatory Visit: Payer: Self-pay

## 2021-08-17 ENCOUNTER — Encounter (HOSPITAL_COMMUNITY)
Admission: RE | Admit: 2021-08-17 | Discharge: 2021-08-17 | Disposition: A | Discharge status: Home / Self Care | Payer: Medicare PPO | Source: Ambulatory Visit | Attending: Urology | Admitting: Urology

## 2021-08-17 DIAGNOSIS — C61 Malignant neoplasm of prostate: Secondary | ICD-10-CM | POA: Diagnosis present

## 2021-08-17 MED ORDER — PIFLIFOLASTAT F 18 (PYLARIFY) INJECTION
9.0000 | Freq: Once | INTRAVENOUS | Status: AC
  Administered 2021-08-17: 8.7 via INTRAVENOUS

## 2021-08-28 ENCOUNTER — Other Ambulatory Visit: Payer: Self-pay | Admitting: Urology

## 2021-08-28 DIAGNOSIS — R9721 Rising PSA following treatment for malignant neoplasm of prostate: Secondary | ICD-10-CM

## 2021-09-18 ENCOUNTER — Other Ambulatory Visit: Payer: Self-pay

## 2021-09-18 ENCOUNTER — Ambulatory Visit
Admission: RE | Admit: 2021-09-18 | Discharge: 2021-09-18 | Disposition: A | Discharge status: Home / Self Care | Payer: Medicare PPO | Source: Ambulatory Visit | Attending: Urology | Admitting: Urology

## 2021-09-18 DIAGNOSIS — R9721 Rising PSA following treatment for malignant neoplasm of prostate: Secondary | ICD-10-CM

## 2021-09-18 MED ORDER — GADOBENATE DIMEGLUMINE 529 MG/ML IV SOLN
14.0000 mL | Freq: Once | INTRAVENOUS | Status: AC | PRN
  Administered 2021-09-18: 14 mL via INTRAVENOUS

## 2021-11-07 ENCOUNTER — Telehealth: Payer: Self-pay | Admitting: Radiation Oncology

## 2021-11-07 NOTE — Telephone Encounter (Signed)
LVM to schedule consult with Dr. Manning.  ?

## 2021-11-12 ENCOUNTER — Telehealth: Payer: Self-pay | Admitting: Radiation Oncology

## 2021-11-12 NOTE — Telephone Encounter (Signed)
LVM to schedule consultation with Dr. Tammi Klippel ?

## 2021-11-19 NOTE — Progress Notes (Signed)
GU Location of Tumor / Histology: Prostate Ca ? ?If Prostate Cancer, Gleason Score is (4 + 4) and PSA is (6.5 as of 07/2021) ? ?Biopsies: ?Dr. Cain Sieve ? ? ? ? ?Past/Anticipated interventions by urology, if any:  ? ? ?Past/Anticipated interventions by medical oncology, if any: NA ? ?Weight changes, if any: No ? ?IPSS:  15 ?SHIM:  21 ? ?Bowel/Bladder complaints, if any:  No ? ?Nausea/Vomiting, if any: No ? ?Pain issues, if any:  0/10 ? ?SAFETY ISSUES: ?Prior radiation? Yes, prostate brachytherapy (07/2017) ?Pacemaker/ICD? No, cardiac stent ?Possible current pregnancy? Male ?Is the patient on methotrexate? No. ? ?Current Complaints / other details:  Gathering information may choose treatment at [facility]. ?

## 2021-11-23 ENCOUNTER — Ambulatory Visit
Admission: RE | Admit: 2021-11-23 | Discharge: 2021-11-23 | Disposition: A | Discharge status: Home / Self Care | Payer: Medicare PPO | Source: Ambulatory Visit | Attending: Radiation Oncology | Admitting: Radiation Oncology

## 2021-11-23 ENCOUNTER — Other Ambulatory Visit: Payer: Self-pay

## 2021-11-23 VITALS — BP 126/60 | HR 79 | Temp 98.4°F | Resp 18 | Ht 66.0 in | Wt 150.6 lb

## 2021-11-23 DIAGNOSIS — C61 Malignant neoplasm of prostate: Secondary | ICD-10-CM

## 2021-11-23 NOTE — Progress Notes (Signed)
?Radiation Oncology         [phone] ?________________________________ ? ?Outpatient Re-Consultation ? ?Name: Tyler Farley MRN: [id number]  ?Date: 11/23/2021  DOB: April 24, 1961 ? ?WI:OMBTDH, [name], MD  [name], MD  ? ?REFERRING PHYSICIAN: [name], MD ? ?DIAGNOSIS: 61 y.o. gentleman with locally recurrent adenocarcinoma of the prostate, s/p brachytherapy in 07/2017 ? ?  ICD-10-CM   ?1. Malignant neoplasm of prostate ([place])  C61   ?  ? ? ?HISTORY OF PRESENT ILLNESS: Tyler Farley is a 61 y.o. male with a diagnosis of prostate cancer. In summary, he was initially diagnosed with Gleason 4+3 prostate cancer on 12/10/16 by Dr. Karsten Ro. PSA at time of diagnosis was 10. We met the patient in consult on 01/30/17, and he ultimately elected to proceed with brachytherapy. He received one dose of Lupron on 02/26/17 due to a delay in treatment secondary to placement of a cardiac stent and need for anticoagulation.  He was able to proceed with the radioactive seed implant on 08/22/17 and tolerated the procedure well. ? ?His PSA reached a nadir of 0.82 in 04/2018 but subsequently began to rise thereafter.  The PSA was 1.73 in November 2020 and increased up to 6.92 in November 2022.  The PSA remained elevated at 6.45 in January 2023 so a PSMA PET scan was performed on 08/17/21, which did not show any evidence for local recurrence or metastatic disease.  A prostate MRI was performed on 09/18/21 showing a sub-cm PI-RADS 4 lesion with restricted diffusion and early contrast enhancement in the central left apex, adjacent to the urethra but no evidence of extracapsular extension or pelvic metastatic disease. ? ?The patient proceeded to MRI fusion biopsy of the prostate on 10/25/21.  The prostate volume measured 15.3 cc.  Out of 16 core biopsies, 4 were positive.  The maximum Gleason score was 4+4, and this was seen in 2 of 4 samples from the MRI ROI as well as, in cores from the left apex, and right apex (with perineural  invasion). ? ?He met with Dr. Alinda Money earlier today and his recommendation is to refer the patient to [facility] multidisciplinary urology clinic to meet with one of the urologists that has more experience with focal ablative procedures for recurrent prostate cancer, such as cryotherapy or HiFU.  All of his cardiology care remains at [facility] so the patient is very comfortable with this recommendation. ? ?The patient reviewed the recent biopsy results with his urologist and he has kindly been referred today for discussion of potential radiation treatment options. ? ? ?PREVIOUS RADIATION THERAPY: Yes  ?08/22/17: Prostate radioactive seed implant / 145 Gy ? ?PAST MEDICAL HISTORY:  ?Past Medical History:  ?Diagnosis Date  ? Coronary artery disease   ? Diabetes ([place])   ? ED (erectile dysfunction)   ? Glaucoma   ? History of colonic polyps   ? History of kidney stones   ? Hyperlipemia   ? Hypertension   ? Ischemic toe ulcer ([place])   ? Myocardial infarction [facility])   ? Peripheral vascular disease ([place])   ? Pneumonia   ? Prostate cancer ( [place]) 2018  ? Retinopathy, background, proliferative   ? Sleep apnea   ? Stroke [facility])   ?   ? ?PAST SURGICAL HISTORY: ?Past Surgical History:  ?Procedure Laterality Date  ? BRONCHOSCOPY    ? COLONOSCOPY WITH PROPOFOL N/A 03/04/2018  ? Procedure: COLONOSCOPY WITH PROPOFOL;  Surgeon: Toledo, Benay Pike, MD;  Location: [facility] ENDOSCOPY;  Service: Gastroenterology;  Laterality: N/A;  ? EYE SURGERY    ? FEMORAL-POPLITEAL BYPASS GRAFT Bilateral 2013, 2014  ? PROSTATE BIOPSY  12/10/2016  ? RADIOACTIVE SEED IMPLANT N/A 08/22/2017  ? Procedure: RADIOACTIVE SEED IMPLANT/BRACHYTHERAPY IMPLANT;  Surgeon: Kathie Rhodes, MD;  Location: [facility] ORS;  Service: Urology;  Laterality: N/A;  ? REFRACTIVE SURGERY    ? Right 2017, Left 2007  ? SPACE OAR INSTILLATION N/A 08/22/2017  ? Procedure: SPACE OAR INSTILLATION;  Surgeon: Kathie Rhodes, MD;  Location: [facility] OR[facility];  Service: Urology;  Laterality: N/A;  ? ? ?FAMILY HISTORY:  ?Family  History  ?Problem Relation Age of Onset  ? Prostate cancer Maternal Uncle   ? Kidney cancer Neg Hx   ? Bladder Cancer Neg Hx   ? ? ?SOCIAL HISTORY:  ?Social History  ? ?Socioeconomic History  ? Marital status: Married  ?  Spouse name: Not on file  ? Number of children: Not on file  ? Years of education: Not on file  ? Highest education level: Not on file  ?Occupational History  ? Not on file  ?Tobacco Use  ? Smoking status: Never  ? Smokeless tobacco: Never  ?Vaping Use  ? Vaping Use: Never used  ?Substance and Sexual Activity  ? Alcohol use: No  ? Drug use: No  ? Sexual activity: Never  ?Other Topics Concern  ? Not on file  ?Social History Narrative  ? Not on file  ? ?Social Determinants of Health  ? ?Financial Resource Strain: Not on file  ?Food Insecurity: Not on file  ?Transportation Needs: Not on file  ?Physical Activity: Not on file  ?Stress: Not on file  ?Social Connections: Not on file  ?Intimate Partner Violence: Not on file  ? ? ?ALLERGIES: Atorvastatin ? ?MEDICATIONS:  ?Current Outpatient Medications  ?Medication Sig Dispense Refill  ? Alpha-Lipoic Acid 200 MG CAPS Take 400 mg by mouth 2 (two) times daily.     ? Amino Acids (L-CARNITINE PO) Take 800 mg by mouth 2 (two) times daily.    ? arginine 500 MG tablet Take 500 mg by mouth 2 (two) times daily.    ? aspirin 81 MG tablet Take 81 mg by mouth daily.     ? atorvastatin (LIPITOR) 20 MG tablet Take 20 mg by mouth daily.     ? B Complex Vitamins (VITAMIN-B COMPLEX) TABS Take 1 tablet by mouth daily.     ? baclofen (LIORESAL) 10 MG tablet Take 10 mg by mouth at bedtime.     ? carvedilol (COREG) 25 MG tablet Take 25 mg by mouth 2 (two) times daily with a meal.     ? Cholecalciferol (VITAMIN D3) 1000 units CAPS Take 1,000 Units by mouth daily.    ? clopidogrel (PLAVIX) 75 MG tablet Take 75 mg by mouth daily.    ? Coenzyme Q10 100 MG capsule Take 100 mg by mouth daily.     ? ezetimibe (ZETIA) 10 MG tablet Take 10 mg by mouth daily.    ? hydrochlorothiazide  (HYDRODIURIL) 12.5 MG tablet Take 12.5 mg by mouth daily.     ? HYDROcodone-acetaminophen (NORCO) 10-325 MG tablet Take 1-2 tablets by mouth every 4 (four) hours as needed for moderate pain. Maximum dose per 24 hours - 8 pills 8 tablet 0  ? LANTUS SOLOSTAR 100 UNIT/ML Solostar Pen Inject 20 Units into the skin at bedtime.     ? losartan (COZAAR) 100 MG tablet Take 100 mg by mouth at bedtime.     ? atorvastatin (  LIPITOR) 20 MG tablet Take 1 tablet by mouth at bedtime.    ? Cholecalciferol 50 MCG (2000 UT) TABS Take by mouth.    ? ciprofloxacin (CIPRO) 500 MG tablet Take 1 tablet (500 mg total) by mouth 2 (two) times daily. (Patient not taking: Reported on 03/04/2018) 6 tablet 0  ? ferrous sulfate 325 (65 FE) MG tablet Take 325 mg by mouth daily with breakfast. (Patient not taking: Reported on 11/23/2021)    ? losartan (COZAAR) 100 MG tablet Take 1 tablet by mouth daily.    ? Misc Natural Products (BETA-SITOSTEROL PLANT STEROLS PO) Take 1 capsule by mouth at bedtime. (Patient not taking: Reported on 11/23/2021)    ? nitroGLYCERIN (NITROSTAT) 0.4 MG SL tablet Place 0.4 mg under the tongue every 5 (five) minutes as needed for chest pain.    ? NOVOLOG FLEXPEN 100 UNIT/ML FlexPen Inject 5-12 Units into the skin 3 (three) times daily with meals. Per sliding scale    ? promethazine (PHENERGAN) 25 MG tablet Take 25 mg by mouth every 6 (six) hours as needed for nausea or vomiting. (Patient not taking: Reported on 11/23/2021)    ? Turmeric 500 MG CAPS Take 500 mg by mouth 2 (two) times daily. (Patient not taking: Reported on 11/23/2021)    ? ?No current facility-administered medications for this encounter.  ? ? ?REVIEW OF SYSTEMS:  On review of systems, the patient reports that he is doing well overall. He denies any chest pain, shortness of breath, cough, fevers, chills, night sweats, unintended weight changes. He denies any bowel disturbances, and denies abdominal pain, nausea or vomiting. He denies any new musculoskeletal or  joint aches or pains. His IPSS was 15, indicating moderate urinary symptoms. His SHIM was 21, indicating he has mild erectile dysfunction. A complete review of systems is obtained and is otherwise negative. ? ?  ?PHYSI

## 2021-12-03 NOTE — Progress Notes (Signed)
Patient has oncology consult at [facility] on 01/22/2022 to review other potential treatment options.  ?

## 2022-01-22 NOTE — Progress Notes (Signed)
Patient recently had consult @ [facility] and has decided to proceed with cryotherapy as his treatment option under [facility].

## 2022-03-05 HISTORY — PX: PROSTATE SURGERY: SHX751

## 2022-06-12 ENCOUNTER — Encounter: Payer: Self-pay | Admitting: Ophthalmology

## 2022-06-13 NOTE — Discharge Instructions (Signed)

## 2022-06-17 ENCOUNTER — Other Ambulatory Visit: Payer: Self-pay

## 2022-06-17 ENCOUNTER — Ambulatory Visit: Payer: Medicare PPO | Admitting: Anesthesiology

## 2022-06-17 ENCOUNTER — Ambulatory Visit
Admission: RE | Admit: 2022-06-17 | Discharge: 2022-06-17 | Disposition: A | Discharge status: Home / Self Care | Payer: Medicare PPO | Attending: Ophthalmology | Admitting: Ophthalmology

## 2022-06-17 ENCOUNTER — Encounter
Admission: RE | Disposition: A | Discharge status: Home / Self Care | Payer: Self-pay | Source: Home / Self Care | Attending: Ophthalmology

## 2022-06-17 ENCOUNTER — Encounter: Payer: Self-pay | Admitting: Ophthalmology

## 2022-06-17 DIAGNOSIS — I2510 Atherosclerotic heart disease of native coronary artery without angina pectoris: Secondary | ICD-10-CM | POA: Diagnosis not present

## 2022-06-17 DIAGNOSIS — I252 Old myocardial infarction: Secondary | ICD-10-CM | POA: Insufficient documentation

## 2022-06-17 DIAGNOSIS — Z79899 Other long term (current) drug therapy: Secondary | ICD-10-CM | POA: Insufficient documentation

## 2022-06-17 DIAGNOSIS — I6930 Unspecified sequelae of cerebral infarction: Secondary | ICD-10-CM | POA: Diagnosis not present

## 2022-06-17 DIAGNOSIS — I10 Essential (primary) hypertension: Secondary | ICD-10-CM | POA: Insufficient documentation

## 2022-06-17 DIAGNOSIS — Z955 Presence of coronary angioplasty implant and graft: Secondary | ICD-10-CM | POA: Insufficient documentation

## 2022-06-17 DIAGNOSIS — Z794 Long term (current) use of insulin: Secondary | ICD-10-CM | POA: Diagnosis not present

## 2022-06-17 DIAGNOSIS — E785 Hyperlipidemia, unspecified: Secondary | ICD-10-CM | POA: Insufficient documentation

## 2022-06-17 DIAGNOSIS — Z8546 Personal history of malignant neoplasm of prostate: Secondary | ICD-10-CM | POA: Diagnosis not present

## 2022-06-17 DIAGNOSIS — G4730 Sleep apnea, unspecified: Secondary | ICD-10-CM | POA: Diagnosis not present

## 2022-06-17 DIAGNOSIS — E1036 Type 1 diabetes mellitus with diabetic cataract: Secondary | ICD-10-CM | POA: Insufficient documentation

## 2022-06-17 DIAGNOSIS — H2511 Age-related nuclear cataract, right eye: Secondary | ICD-10-CM | POA: Diagnosis present

## 2022-06-17 HISTORY — PX: CATARACT EXTRACTION W/PHACO: SHX586

## 2022-06-17 LAB — GLUCOSE, CAPILLARY: Glucose-Capillary: 169 mg/dL — ABNORMAL HIGH (ref 70–99)

## 2022-06-17 SURGERY — PHACOEMULSIFICATION, CATARACT, WITH IOL INSERTION
Anesthesia: Monitor Anesthesia Care | Site: Eye | Laterality: Right

## 2022-06-17 MED ORDER — LIDOCAINE HCL (PF) 2 % IJ SOLN
INTRAOCULAR | Status: DC | PRN
  Administered 2022-06-17: 1 mL via INTRAOCULAR

## 2022-06-17 MED ORDER — MIDAZOLAM HCL 2 MG/2ML IJ SOLN
INTRAMUSCULAR | Status: DC | PRN
  Administered 2022-06-17: 1 mg via INTRAVENOUS

## 2022-06-17 MED ORDER — SIGHTPATH DOSE#1 BSS IO SOLN
INTRAOCULAR | Status: DC | PRN
  Administered 2022-06-17: 122 mL via OPHTHALMIC

## 2022-06-17 MED ORDER — ARMC OPHTHALMIC DILATING DROPS
1.0000 | OPHTHALMIC | Status: DC | PRN
  Administered 2022-06-17 (×3): 1 via OPHTHALMIC

## 2022-06-17 MED ORDER — TETRACAINE HCL 0.5 % OP SOLN
1.0000 [drp] | OPHTHALMIC | Status: DC | PRN
  Administered 2022-06-17 (×3): 1 [drp] via OPHTHALMIC

## 2022-06-17 MED ORDER — SIGHTPATH DOSE#1 BSS IO SOLN
INTRAOCULAR | Status: DC | PRN
Start: 2022-06-17 — End: 2022-06-17
  Administered 2022-06-17: 15 mL

## 2022-06-17 MED ORDER — FENTANYL CITRATE (PF) 100 MCG/2ML IJ SOLN
INTRAMUSCULAR | Status: DC | PRN
  Administered 2022-06-17: 100 ug via INTRAVENOUS

## 2022-06-17 MED ORDER — MOXIFLOXACIN HCL 0.5 % OP SOLN
OPHTHALMIC | Status: DC | PRN
  Administered 2022-06-17: .2 mL via OPHTHALMIC

## 2022-06-17 MED ORDER — SIGHTPATH DOSE#1 NA HYALUR & NA CHOND-NA HYALUR IO KIT
PACK | INTRAOCULAR | Status: DC | PRN
  Administered 2022-06-17: 1 via OPHTHALMIC

## 2022-06-17 SURGICAL SUPPLY — 13 items
CATARACT SUITE SIGHTPATH (MISCELLANEOUS) ×1 IMPLANT
DISSECTOR HYDRO NUCLEUS 50X22 (MISCELLANEOUS) ×1 IMPLANT
FEE CATARACT SUITE SIGHTPATH (MISCELLANEOUS) ×1 IMPLANT
GLOVE SURG GAMMEX PI TX LF 7.5 (GLOVE) ×1 IMPLANT
GLOVE SURG SYN 8.5  E (GLOVE) ×1
GLOVE SURG SYN 8.5 E (GLOVE) ×1 IMPLANT
GLOVE SURG SYN 8.5 PF PI (GLOVE) ×1 IMPLANT
LENS IOL TECNIS EYHANCE 22.5 (Intraocular Lens) IMPLANT
NDL FILTER BLUNT 18X1 1/2 (NEEDLE) ×1 IMPLANT
NEEDLE FILTER BLUNT 18X1 1/2 (NEEDLE) ×1 IMPLANT
SYR 3ML LL SCALE MARK (SYRINGE) ×1 IMPLANT
SYR 5ML LL (SYRINGE) ×1 IMPLANT
WATER STERILE IRR 250ML POUR (IV SOLUTION) ×1 IMPLANT

## 2022-06-17 NOTE — H&P (Signed)
[place]   Primary Care Physician:  [name], MD Ophthalmologist: Dr. Benay Pillow  Pre-Procedure History & Physical: HPI:  Tyler Farley is a 61 y.o. male here for cataract surgery.   Past Medical History:  Diagnosis Date   Coronary artery disease    Diabetes [facility])    ED (erectile dysfunction)    Glaucoma    History of colonic polyps    History of kidney stones    Hyperlipemia    Hypertension    Ischemic toe ulcer ([place])    Myocardial infarction ([place]) 2018   Peripheral vascular disease ([place])    Pneumonia    Prostate cancer ([place]) 2018   Retinopathy, background, proliferative    Sleep apnea    Stroke ([place]) 2018   left side weakness    Past Surgical History:  Procedure Laterality Date   BRONCHOSCOPY     CARDIAC CATHETERIZATION  2018   Stent   COLONOSCOPY WITH PROPOFOL N/A 03/04/2018   Procedure: COLONOSCOPY WITH PROPOFOL;  Surgeon: Toledo, Benay Pike, MD;  Location: [facility] ENDOSCOPY;  Service: Gastroenterology;  Laterality: N/A;   EYE SURGERY     FEMORAL-POPLITEAL BYPASS GRAFT Bilateral 2013, 2014   PROSTATE BIOPSY  12/10/2016   PROSTATE SURGERY  03/05/2022   [facility]   RADIOACTIVE SEED IMPLANT N/A 08/22/2017   Procedure: RADIOACTIVE SEED IMPLANT/BRACHYTHERAPY IMPLANT;  Surgeon: Kathie Rhodes, MD;  [facility];  Service: Urology;  Laterality: N/A;   REFRACTIVE SURGERY     Right 2017, Left 2007   SPACE OAR INSTILLATION N/A 08/22/2017   Procedure: SPACE OAR INSTILLATION;  Surgeon: Kathie Rhodes, MD;  [facility];  Service: Urology;  Laterality: N/A;    Prior to Admission medications   Medication Sig Start Date End Date Taking? Authorizing Provider  Alpha-Lipoic Acid 200 MG CAPS Take 400 mg by mouth 2 (two) times daily.    Yes [provider]  Amino Acids (L-CARNITINE PO) Take 800 mg by mouth 2 (two) times daily.   Yes [provider]  aspirin 81 MG tablet Take 81 mg by mouth daily.  11/20/10  Yes [provider]   atorvastatin (LIPITOR) 20 MG tablet Take 1 tablet by mouth at bedtime. 03/15/21  Yes [provider]  B Complex Vitamins (VITAMIN-B COMPLEX) TABS Take 1 tablet by mouth daily.    Yes [provider]  baclofen (LIORESAL) 10 MG tablet Take 10 mg by mouth at bedtime.  01/05/16  Yes [provider]  carvedilol (COREG) 25 MG tablet Take 25 mg by mouth 2 (two) times daily with a meal.  01/05/16  Yes [provider]  Cholecalciferol 50 MCG (2000 UT) TABS Take by mouth.   Yes [provider]  Coenzyme Q10 100 MG capsule Take 100 mg by mouth daily.  01/31/16  Yes [provider]  ezetimibe (ZETIA) 10 MG tablet Take 10 mg by mouth daily.   Yes [provider]  hydrochlorothiazide (HYDRODIURIL) 12.5 MG tablet Take 12.5 mg by mouth daily.  02/19/16  Yes [provider]  LANTUS SOLOSTAR 100 UNIT/ML Solostar Pen Inject 20 Units into the skin at bedtime.  01/31/16  Yes [provider]  losartan (COZAAR) 100 MG tablet Take 1 tablet by mouth daily. 09/24/21  Yes [provider]  nitroGLYCERIN (NITROSTAT) 0.4 MG SL tablet Place 0.4 mg under the tongue every 5 (five) minutes as needed for chest pain.   Yes [provider]  NOVOLOG FLEXPEN 100 UNIT/ML FlexPen Inject 5-12  Units into the skin 3 (three) times daily with meals. Per sliding scale 01/28/16  Yes [provider]    Allergies as of 05/24/2022 - Review Complete 11/24/2021  Allergen Reaction Noted   Atorvastatin Other (See Comments) 02/14/2013    Family History  Problem Relation Age of Onset   Prostate cancer Maternal Uncle    Kidney cancer Neg Hx    Bladder Cancer Neg Hx     Social History   Socioeconomic History   Marital status: Married    Spouse name: Not on file   Number of children: Not on file   Years of education: Not on file   Highest education level: Not on file  Occupational History   Not on file  Tobacco Use   Smoking status: Never    Smokeless tobacco: Never  Vaping Use   Vaping Use: Never used  Substance and Sexual Activity   Alcohol use: No   Drug use: No   Sexual activity: Never  Other Topics Concern   Not on file  Social History Narrative   Not on file   Social Determinants of Health   Financial Resource Strain: Not on file  Food Insecurity: Not on file  Transportation Needs: Not on file  Physical Activity: Not on file  Stress: Not on file  Social Connections: Not on file  Intimate Partner Violence: Not on file    Review of Systems: See HPI, otherwise negative ROS  Physical Exam: BP (!) 151/72   Pulse 69   Temp 98.1 F (36.7 C) (Temporal)   Ht '5\' 6"'$  (1.676 m)   Wt 70.3 kg   SpO2 95%   BMI 25.02 kg/m  General:   Alert, cooperative in NAD Head:  Normocephalic and atraumatic. Respiratory:  Normal work of breathing. Cardiovascular:  RRR  Impression/Plan: Tyler Farley is here for cataract surgery.  Risks, benefits, limitations, and alternatives regarding cataract surgery have been reviewed with the patient.  Questions have been answered.  All parties agreeable.   Benay Pillow, MD  06/17/2022, 7:55 AM

## 2022-06-17 NOTE — Anesthesia Preprocedure Evaluation (Signed)
Anesthesia Evaluation  Patient identified by MRN, date of birth, ID band Patient awake    Reviewed: Allergy & Precautions, NPO status , Patient's Chart, lab work & pertinent test results  History of Anesthesia Complications Negative for: history of anesthetic complications  Airway Mallampati: II  TM Distance: >3 FB Neck ROM: full    Dental  (+) Teeth Intact   Pulmonary sleep apnea    Pulmonary exam normal        Cardiovascular hypertension, On Medications (-) angina + CAD, + Past MI, + Cardiac Stents and + Peripheral Vascular Disease  (-) DOE Normal cardiovascular exam     Neuro/Psych CVA, Residual Symptoms  negative psych ROS   GI/Hepatic negative GI ROS, Neg liver ROS,,,  Endo/Other  negative endocrine ROSdiabetes, Well Controlled, Type 1, Insulin Dependent    Renal/GU Renal disease     Musculoskeletal   Abdominal   Peds  Hematology negative hematology ROS (+)   Anesthesia Other Findings Past Medical History: No date: Coronary artery disease No date: Diabetes ([facility]) No date: ED (erectile dysfunction) No date: Glaucoma No date: History of colonic polyps No date: History of kidney stones No date: Hyperlipemia No date: Hypertension No date: Ischemic toe ulcer ([place]) 2018: Myocardial infarction ([place]) No date: Peripheral vascular disease ([place]) No date: Pneumonia 2018: Prostate cancer ([place]) No date: Retinopathy, background, proliferative No date: Sleep apnea 2018: Stroke ([place])     Comment:  left side weakness  Past Surgical History: No date: BRONCHOSCOPY 2018: CARDIAC CATHETERIZATION     Comment:  Stent 03/04/2018: COLONOSCOPY WITH PROPOFOL; N/A     Comment:  Procedure: COLONOSCOPY WITH PROPOFOL;  Surgeon: Toledo,               Benay Pike, MD;  Location: [facility] ENDOSCOPY;  Service:               Gastroenterology;  Laterality: N/A; No date: EYE SURGERY 2013, 2014: FEMORAL-POPLITEAL BYPASS GRAFT;  Bilateral 12/10/2016: PROSTATE BIOPSY 03/05/2022: PROSTATE SURGERY     Comment:  [facility] 08/22/2017: RADIOACTIVE SEED IMPLANT; N/A     Comment:  Procedure: RADIOACTIVE SEED IMPLANT/BRACHYTHERAPY               IMPLANT;  Surgeon: Kathie Rhodes, MD;  [facility];               Service: Urology;  Laterality: N/A; No date: REFRACTIVE SURGERY     Comment:  Right 2017, Left 2007 08/22/2017: SPACE OAR INSTILLATION; N/A     Comment:  Procedure: SPACE OAR INSTILLATION;  Surgeon: Kathie Rhodes, MD;  [facility];  Service: Urology;                Laterality: N/A;  BMI    Body Mass Index: 25.02 kg/m      Reproductive/Obstetrics negative OB ROS                             Anesthesia Physical Anesthesia Plan  ASA: 3  Anesthesia Plan: MAC   Post-op Pain Management: Minimal or no pain anticipated   Induction: Intravenous  PONV Risk Score and Plan:   Airway Management Planned: Natural Airway and Nasal Cannula  Additional Equipment:   Intra-op Plan:   Post-operative Plan:   Informed Consent: I have reviewed the patients History and Physical, chart, labs and discussed the procedure including the risks, benefits  and alternatives for the proposed anesthesia with the patient or authorized representative who has indicated his/her understanding and acceptance.     Dental Advisory Given  Plan Discussed with: Anesthesiologist, CRNA and Surgeon  Anesthesia Plan Comments: (Patient consented for risks of anesthesia including but not limited to:  - adverse reactions to medications - damage to eyes, teeth, lips or other oral mucosa - nerve damage due to positioning  - sore throat or hoarseness - Damage to heart, brain, nerves, lungs, other parts of body or loss of life  Patient voiced understanding.)       Anesthesia Quick Evaluation

## 2022-06-17 NOTE — Anesthesia Postprocedure Evaluation (Signed)
Anesthesia Post Note  Patient: DHRUVAN GULLION  Procedure(s) Performed: CATARACT EXTRACTION PHACO AND INTRAOCULAR LENS PLACEMENT (IOC) RIGHT DIABETIC (Right: Eye)  Patient location during evaluation: PACU Anesthesia Type: MAC Level of consciousness: awake and alert Pain management: pain level controlled Vital Signs Assessment: post-procedure vital signs reviewed and stable Respiratory status: spontaneous breathing, nonlabored ventilation, respiratory function stable and patient connected to nasal cannula oxygen Cardiovascular status: stable and blood pressure returned to baseline Postop Assessment: no apparent nausea or vomiting Anesthetic complications: no   No notable events documented.   Last Vitals:  Vitals:   06/17/22 0825 06/17/22 0831  BP: (!) 139/58 (!) 150/70  Pulse: 76 67  Resp: 18 13  Temp: 36.6 C   SpO2: 98% 95%    Last Pain:  Vitals:   06/17/22 0831  TempSrc:   PainSc: 0-No pain                 Ilene Qua

## 2022-06-17 NOTE — Op Note (Signed)
OPERATIVE NOTE  Tyler Farley [id number] 06/17/2022   PREOPERATIVE DIAGNOSIS:  Nuclear sclerotic cataract right eye.  H25.11   POSTOPERATIVE DIAGNOSIS:    Nuclear sclerotic cataract right eye.     PROCEDURE:  Phacoemusification with posterior chamber intraocular lens placement of the right eye   LENS:  * No implants in log *     Procedure(s) with comments: CATARACT EXTRACTION PHACO AND INTRAOCULAR LENS PLACEMENT (IOC) RIGHT DIABETIC (Right) - 3.31 0:35.1  DIB00 +22.5   SURGEON:  Benay Pillow, MD, MPH  ANESTHESIOLOGIST: Anesthesiologist: Ilene Qua, MD CRNA: Patience Musca., CRNA   ANESTHESIA:  Topical with tetracaine drops augmented with 1% preservative-free intracameral lidocaine.  ESTIMATED BLOOD LOSS: less than 1 mL.   COMPLICATIONS:  None.   DESCRIPTION OF PROCEDURE:  The patient was identified in the holding room and transported to the operating room and placed in the supine position under the operating microscope.  The right eye was identified as the operative eye and it was prepped and draped in the usual sterile ophthalmic fashion.   A 1.0 millimeter clear-corneal paracentesis was made at the 10:30 position. 0.5 ml of preservative-free 1% lidocaine with epinephrine was injected into the anterior chamber.  The anterior chamber was filled with viscoelastic.  A 2.4 millimeter keratome was used to make a near-clear corneal incision at the 8:00 position.  A curvilinear capsulorrhexis was made with a cystotome and capsulorrhexis forceps.  Balanced salt solution was used to hydrodissect and hydrodelineate the nucleus.   Phacoemulsification was then used in stop and chop fashion to remove the lens nucleus and epinucleus.  The remaining cortex was then removed using the irrigation and aspiration handpiece. Viscoelastic was then placed into the capsular bag to distend it for lens placement.  A lens was then injected into the capsular bag.  The remaining viscoelastic was  aspirated.   Wounds were hydrated with balanced salt solution.  The anterior chamber was inflated to a physiologic pressure with balanced salt solution.   Intracameral vigamox 0.1 mL undiluted was injected into the eye and a drop placed onto the ocular surface.  No wound leaks were noted.  The patient was taken to the recovery room in stable condition without complications of anesthesia or surgery  Benay Pillow 06/17/2022, 8:24 AM

## 2022-06-17 NOTE — Transfer of Care (Signed)
Immediate Anesthesia Transfer of Care Note  Patient: Tyler Farley  Procedure(s) Performed: CATARACT EXTRACTION PHACO AND INTRAOCULAR LENS PLACEMENT (IOC) RIGHT DIABETIC (Right: Eye)  Patient Location: PACU  Anesthesia Type: MAC  Level of Consciousness: awake, alert  and patient cooperative  Airway and Oxygen Therapy: Patient Spontanous Breathing and Patient connected to supplemental oxygen  Post-op Assessment: Post-op Vital signs reviewed, Patient's Cardiovascular Status Stable, Respiratory Function Stable, Patent Airway and No signs of Nausea or vomiting  Post-op Vital Signs: Reviewed and stable  Complications: No notable events documented.

## 2022-06-18 ENCOUNTER — Encounter: Payer: Self-pay | Admitting: Ophthalmology

## 2022-07-12 ENCOUNTER — Encounter: Payer: Self-pay | Admitting: Ophthalmology

## 2022-07-17 NOTE — Discharge Instructions (Signed)

## 2022-07-19 ENCOUNTER — Other Ambulatory Visit: Payer: Self-pay

## 2022-07-19 ENCOUNTER — Encounter: Payer: Self-pay | Admitting: Ophthalmology

## 2022-07-19 ENCOUNTER — Ambulatory Visit
Admission: RE | Admit: 2022-07-19 | Discharge: 2022-07-19 | Disposition: A | Discharge status: Home / Self Care | Payer: Medicare PPO | Attending: Ophthalmology | Admitting: Ophthalmology

## 2022-07-19 ENCOUNTER — Encounter
Admission: RE | Disposition: A | Discharge status: Home / Self Care | Payer: Self-pay | Source: Home / Self Care | Attending: Ophthalmology

## 2022-07-19 ENCOUNTER — Ambulatory Visit: Payer: Medicare PPO | Admitting: Anesthesiology

## 2022-07-19 DIAGNOSIS — I252 Old myocardial infarction: Secondary | ICD-10-CM | POA: Insufficient documentation

## 2022-07-19 DIAGNOSIS — H2512 Age-related nuclear cataract, left eye: Secondary | ICD-10-CM | POA: Diagnosis not present

## 2022-07-19 DIAGNOSIS — E1051 Type 1 diabetes mellitus with diabetic peripheral angiopathy without gangrene: Secondary | ICD-10-CM | POA: Insufficient documentation

## 2022-07-19 DIAGNOSIS — I69354 Hemiplegia and hemiparesis following cerebral infarction affecting left non-dominant side: Secondary | ICD-10-CM | POA: Insufficient documentation

## 2022-07-19 DIAGNOSIS — I2510 Atherosclerotic heart disease of native coronary artery without angina pectoris: Secondary | ICD-10-CM | POA: Diagnosis not present

## 2022-07-19 DIAGNOSIS — N289 Disorder of kidney and ureter, unspecified: Secondary | ICD-10-CM | POA: Diagnosis not present

## 2022-07-19 DIAGNOSIS — E1036 Type 1 diabetes mellitus with diabetic cataract: Secondary | ICD-10-CM | POA: Diagnosis present

## 2022-07-19 DIAGNOSIS — Z794 Long term (current) use of insulin: Secondary | ICD-10-CM | POA: Insufficient documentation

## 2022-07-19 DIAGNOSIS — I10 Essential (primary) hypertension: Secondary | ICD-10-CM | POA: Insufficient documentation

## 2022-07-19 DIAGNOSIS — G4730 Sleep apnea, unspecified: Secondary | ICD-10-CM | POA: Insufficient documentation

## 2022-07-19 HISTORY — PX: CATARACT EXTRACTION W/PHACO: SHX586

## 2022-07-19 LAB — GLUCOSE, CAPILLARY: Glucose-Capillary: 128 mg/dL — ABNORMAL HIGH (ref 70–99)

## 2022-07-19 SURGERY — PHACOEMULSIFICATION, CATARACT, WITH IOL INSERTION
Anesthesia: Monitor Anesthesia Care | Site: Eye | Laterality: Left

## 2022-07-19 MED ORDER — LACTATED RINGERS IV SOLN: INTRAVENOUS | Status: DC

## 2022-07-19 MED ORDER — SIGHTPATH DOSE#1 BSS IO SOLN
INTRAOCULAR | Status: DC | PRN
  Administered 2022-07-19: 15 mL

## 2022-07-19 MED ORDER — LIDOCAINE HCL (PF) 2 % IJ SOLN
INTRAOCULAR | Status: DC | PRN
  Administered 2022-07-19: 1 mL via INTRAOCULAR

## 2022-07-19 MED ORDER — SIGHTPATH DOSE#1 BSS IO SOLN
INTRAOCULAR | Status: DC | PRN
  Administered 2022-07-19: 132 mL via OPHTHALMIC

## 2022-07-19 MED ORDER — MIDAZOLAM HCL 2 MG/2ML IJ SOLN
INTRAMUSCULAR | Status: DC | PRN
  Administered 2022-07-19: 2 mg via INTRAVENOUS

## 2022-07-19 MED ORDER — TETRACAINE HCL 0.5 % OP SOLN
1.0000 [drp] | OPHTHALMIC | Status: DC | PRN
  Administered 2022-07-19 (×3): 1 [drp] via OPHTHALMIC

## 2022-07-19 MED ORDER — SIGHTPATH DOSE#1 NA HYALUR & NA CHOND-NA HYALUR IO KIT
PACK | INTRAOCULAR | Status: DC | PRN
  Administered 2022-07-19: 1 via OPHTHALMIC

## 2022-07-19 MED ORDER — MOXIFLOXACIN HCL 0.5 % OP SOLN
OPHTHALMIC | Status: DC | PRN
  Administered 2022-07-19: .2 mL via OPHTHALMIC

## 2022-07-19 MED ORDER — ARMC OPHTHALMIC DILATING DROPS
1.0000 | OPHTHALMIC | Status: DC | PRN
  Administered 2022-07-19 (×3): 1 via OPHTHALMIC

## 2022-07-19 SURGICAL SUPPLY — 13 items
CATARACT SUITE SIGHTPATH (MISCELLANEOUS) ×1 IMPLANT
DISSECTOR HYDRO NUCLEUS 50X22 (MISCELLANEOUS) ×1 IMPLANT
FEE CATARACT SUITE SIGHTPATH (MISCELLANEOUS) ×1 IMPLANT
GLOVE SURG GAMMEX PI TX LF 7.5 (GLOVE) ×1 IMPLANT
GLOVE SURG SYN 8.5  E (GLOVE) ×1
GLOVE SURG SYN 8.5 E (GLOVE) ×1 IMPLANT
GLOVE SURG SYN 8.5 PF PI (GLOVE) ×1 IMPLANT
LENS IOL TECNIS EYHANCE 23.0 (Intraocular Lens) IMPLANT
NDL FILTER BLUNT 18X1 1/2 (NEEDLE) ×1 IMPLANT
NEEDLE FILTER BLUNT 18X1 1/2 (NEEDLE) ×1 IMPLANT
SYR 3ML LL SCALE MARK (SYRINGE) ×1 IMPLANT
SYR 5ML LL (SYRINGE) ×1 IMPLANT
WATER STERILE IRR 250ML POUR (IV SOLUTION) ×1 IMPLANT

## 2022-07-19 NOTE — Anesthesia Preprocedure Evaluation (Signed)
Anesthesia Evaluation  Patient identified by MRN, date of birth, ID band Patient awake    Reviewed: Allergy & Precautions, NPO status , Patient's Chart, lab work & pertinent test results  History of Anesthesia Complications Negative for: history of anesthetic complications  Airway Mallampati: II  TM Distance: >3 FB Neck ROM: full    Dental  (+) Teeth Intact   Pulmonary sleep apnea    Pulmonary exam normal        Cardiovascular hypertension, On Medications (-) angina + CAD, + Past MI, + Cardiac Stents and + Peripheral Vascular Disease  (-) DOE Normal cardiovascular exam     Neuro/Psych CVA, Residual Symptoms  negative psych ROS   GI/Hepatic negative GI ROS, Neg liver ROS,,,  Endo/Other  negative endocrine ROSdiabetes, Well Controlled, Type 1, Insulin Dependent    Renal/GU Renal disease     Musculoskeletal   Abdominal   Peds  Hematology negative hematology ROS (+)   Anesthesia Other Findings Past Medical History: No date: Coronary artery disease No date: Diabetes (HCC) No date: ED (erectile dysfunction) No date: Glaucoma No date: History of colonic polyps No date: History of kidney stones No date: Hyperlipemia No date: Hypertension No date: Ischemic toe ulcer ([place]) 2018: Myocardial infarction ([place]) No date: Peripheral vascular disease ([place]) No date: Pneumonia 2018: Prostate cancer ([place]) No date: Retinopathy, background, proliferative No date: Sleep apnea 2018: Stroke ([place])     Comment:  left side weakness  Past Surgical History: No date: BRONCHOSCOPY 2018: CARDIAC CATHETERIZATION     Comment:  Stent 03/04/2018: COLONOSCOPY WITH PROPOFOL; N/A     Comment:  Procedure: COLONOSCOPY WITH PROPOFOL;  Surgeon: [place],               Benay Pike, MD;  Location: [facility];  Service:               Gastroenterology;  Laterality: N/A; No date: EYE SURGERY 2013, 2014: FEMORAL-POPLITEAL BYPASS GRAFT;  Bilateral 12/10/2016: PROSTATE BIOPSY 03/05/2022: PROSTATE SURGERY     Comment:  [facility] 08/22/2017: RADIOACTIVE SEED IMPLANT; N/A     Comment:  Procedure: RADIOACTIVE SEED IMPLANT/BRACHYTHERAPY               IMPLANT;  Surgeon: Kathie Rhodes, MD;  [facility]: [facility];               Service: Urology;  Laterality: N/A; No date: REFRACTIVE SURGERY     Comment:  Right 2017, Left 2007 08/22/2017: SPACE OAR INSTILLATION; N/A     Comment:  Procedure: SPACE OAR INSTILLATION;  Surgeon: Kathie Rhodes, MD;  [facility]: [facility];  Service: Urology;                Laterality: N/A;  BMI    Body Mass Index: 25.02 kg/m      Reproductive/Obstetrics negative OB ROS                              Anesthesia Physical Anesthesia Plan  ASA: 3  Anesthesia Plan: MAC   Post-op Pain Management: Minimal or no pain anticipated   Induction: Intravenous  PONV Risk Score and Plan:   Airway Management Planned: Natural Airway and Nasal Cannula  Additional Equipment:   Intra-op Plan:   Post-operative Plan:   Informed Consent: I have reviewed the patients History and Physical, chart, labs and discussed the procedure including the risks,  benefits and alternatives for the proposed anesthesia with the patient or authorized representative who has indicated his/her understanding and acceptance.     Dental Advisory Given  Plan Discussed with: Anesthesiologist, CRNA and Surgeon  Anesthesia Plan Comments: (Patient consented for risks of anesthesia including but not limited to:  - adverse reactions to medications - damage to eyes, teeth, lips or other oral mucosa - nerve damage due to positioning  - sore throat or hoarseness - Damage to heart, brain, nerves, lungs, other parts of body or loss of life  Patient voiced understanding.)        Anesthesia Quick Evaluation

## 2022-07-19 NOTE — Anesthesia Postprocedure Evaluation (Signed)
Anesthesia Post Note  Patient: [name]  Procedure(s) Performed: CATARACT EXTRACTION PHACO AND INTRAOCULAR LENS PLACEMENT (IOC) LEFT DIABETIC  6.37  01:00.6 (Left: Eye)  Patient location during evaluation: PACU Anesthesia Type: MAC Level of consciousness: awake and alert Pain management: pain level controlled Vital Signs Assessment: post-procedure vital signs reviewed and stable Respiratory status: spontaneous breathing, nonlabored ventilation, respiratory function stable and patient connected to nasal cannula oxygen Cardiovascular status: stable and blood pressure returned to baseline Postop Assessment: no apparent nausea or vomiting Anesthetic complications: no   No notable events documented.   Last Vitals:  Vitals:   07/19/22 0822 07/19/22 0823  BP: 134/67 134/67  Pulse: 66 67  Resp: 13 16  Temp: 36.5 C 37.7 C  SpO2: 96% 96%    Last Pain:  Vitals:   07/19/22 0823  TempSrc: Tympanic  PainSc: 0-No pain                 Ilene Qua

## 2022-07-19 NOTE — Transfer of Care (Signed)
Immediate Anesthesia Transfer of Care Note  Patient: Tyler Farley  Procedure(s) Performed: CATARACT EXTRACTION PHACO AND INTRAOCULAR LENS PLACEMENT (I[facility]) LEFT DIABETIC  6.37  01:00.6 (Left: Eye)  Patient Location: PACU  Anesthesia Type: MAC  Level of Consciousness: awake, alert  and patient cooperative  Airway and Oxygen Therapy: Patient Spontanous Breathing and Patient connected to supplemental oxygen  Post-op Assessment: Post-op Vital signs reviewed, Patient's Cardiovascular Status Stable, Respiratory Function Stable, Patent Airway and No signs of Nausea or vomiting  Post-op Vital Signs: Reviewed and stable  Complications: No notable events documented.

## 2022-07-19 NOTE — H&P (Signed)
[place]   Primary Care Physician:  [name], MD Ophthalmologist: Dr. Benay Pillow  Pre-Procedure History & Physical: HPI:  Tyler Farley is a 61 y.o. male here for cataract surgery.   Past Medical History:  Diagnosis Date   Coronary artery disease    Diabetes [facility])    ED (erectile dysfunction)    Glaucoma    History of colonic polyps    History of kidney stones    Hyperlipemia    Hypertension    Ischemic toe ulcer ([place])    Myocardial infarction ([place]) 2018   Peripheral vascular disease ([place])    Pneumonia    Prostate cancer ([place]) 2018   Retinopathy, background, proliferative    Sleep apnea    Stroke ([place]) 2018   left side weakness    Past Surgical History:  Procedure Laterality Date   BRONCHOSCOPY     CARDIAC CATHETERIZATION  2018   Stent   CATARACT EXTRACTION W/PHACO Right 06/17/2022   Procedure: CATARACT EXTRACTION PHACO AND INTRAOCULAR LENS PLACEMENT ([place]) RIGHT DIABETIC;  Surgeon: Eulogio Bear, MD;  Location: [place];  Service: Ophthalmology;  Laterality: Right;  3.31 0:35.1   COLONOSCOPY WITH PROPOFOL N/A 03/04/2018   Procedure: COLONOSCOPY WITH PROPOFOL;  Surgeon: Toledo, Benay Pike, MD;  Location: [facility];  Service: Gastroenterology;  Laterality: N/A;   EYE SURGERY     FEMORAL-POPLITEAL BYPASS GRAFT Bilateral 2013, 2014   PROSTATE BIOPSY  12/10/2016   PROSTATE SURGERY  03/05/2022   [facility]   RADIOACTIVE SEED IMPLANT N/A 08/22/2017   Procedure: RADIOACTIVE SEED IMPLANT/BRACHYTHERAPY IMPLANT;  Surgeon: Kathie Rhodes, MD;  [facility];  Service: Urology;  Laterality: N/A;   REFRACTIVE SURGERY     Right 2017, Left 2007   SPACE OAR INSTILLATION N/A 08/22/2017   Procedure: SPACE OAR INSTILLATION;  Surgeon: Kathie Rhodes, MD;  [facility];  Service: Urology;  Laterality: N/A;    Prior to Admission medications   Medication Sig Start Date End Date Taking? Authorizing Provider  aspirin 81 MG tablet Take 81 mg by mouth  daily.  11/20/10  Yes [provider]  atorvastatin (LIPITOR) 20 MG tablet Take 1 tablet by mouth at bedtime. 03/15/21  Yes [provider]  B Complex Vitamins (VITAMIN-B COMPLEX) TABS Take 1 tablet by mouth daily.    Yes [provider]  baclofen (LIORESAL) 10 MG tablet Take 10 mg by mouth at bedtime.  01/05/16  Yes [provider]  carvedilol (COREG) 25 MG tablet Take 25 mg by mouth 2 (two) times daily with a meal.  01/05/16  Yes [provider]  Cholecalciferol 50 MCG (2000 UT) TABS Take by mouth.   Yes [provider]  Coenzyme Q10 100 MG capsule Take 100 mg by mouth daily.  01/31/16  Yes [provider]  ezetimibe (ZETIA) 10 MG tablet Take 10 mg by mouth daily.   Yes [provider]  hydrochlorothiazide (HYDRODIURIL) 12.5 MG tablet Take 12.5 mg by mouth daily.  02/19/16  Yes [provider]  losartan (COZAAR) 100 MG tablet Take 1 tablet by mouth daily. 09/24/21  Yes [provider]  Alpha-Lipoic Acid 200 MG CAPS Take 400 mg by mouth 2 (two) times daily.     [provider]  Amino Acids (L-CARNITINE PO) Take 800 mg by mouth 2 (two) times daily.    [provider]  LANTUS SOLOSTAR 100 UNIT/ML Solostar Pen Inject 20 Units into the skin at bedtime.  01/31/16  [provider]  nitroGLYCERIN (NITROSTAT) 0.4 MG SL tablet Place 0.4 mg under the tongue every 5 (five) minutes as needed for chest pain.    [provider]  NOVOLOG FLEXPEN 100 UNIT/ML FlexPen Inject 5-12 Units into the skin 3 (three) times daily with meals. Per sliding scale 01/28/16   [provider]    Allergies as of 05/24/2022 - Review Complete 11/24/2021  Allergen Reaction Noted   Atorvastatin Other (See Comments) 02/14/2013    Family History  Problem Relation Age of Onset   Prostate cancer Maternal Uncle    Kidney cancer Neg Hx    Bladder Cancer Neg Hx     Social History   Socioeconomic History    Marital status: Married    Spouse name: Not on file   Number of children: Not on file   Years of education: Not on file   Highest education level: Not on file  Occupational History   Not on file  Tobacco Use   Smoking status: Never   Smokeless tobacco: Never  Vaping Use   Vaping Use: Never used  Substance and Sexual Activity   Alcohol use: No   Drug use: No   Sexual activity: Never  Other Topics Concern   Not on file  Social History Narrative   Not on file   Social Determinants of Health   Financial Resource Strain: Not on file  Food Insecurity: Not on file  Transportation Needs: Not on file  Physical Activity: Not on file  Stress: Not on file  Social Connections: Not on file  Intimate Partner Violence: Not on file    Review of Systems: See HPI, otherwise negative ROS  Physical Exam: BP (!) 172/75   Temp 98.2 F (36.8 C) (Tympanic)   Ht '5\' 10"'$  (1.778 m)   Wt 66.9 kg   SpO2 97%   BMI 21.16 kg/m  General:   Alert, cooperative in NAD Head:  Normocephalic and atraumatic. Respiratory:  Normal work of breathing. Cardiovascular:  RRR  Impression/Plan: Tyler Farley is here for cataract surgery.  Risks, benefits, limitations, and alternatives regarding cataract surgery have been reviewed with the patient.  Questions have been answered.  All parties agreeable.   Benay Pillow, MD  07/19/2022, 7:53 AM

## 2022-07-19 NOTE — Op Note (Signed)
OPERATIVE NOTE  DURAN OHERN [id number] 07/19/2022   PREOPERATIVE DIAGNOSIS:  Nuclear sclerotic cataract left eye.  H25.12   POSTOPERATIVE DIAGNOSIS:    Nuclear sclerotic cataract left eye.     PROCEDURE:  Phacoemusification with posterior chamber intraocular lens placement of the left eye   LENS:   Implant Name Type Inv. Item Serial No. Manufacturer Lot No. LRB No. Used Action  LENS IOL TECNIS EYHANCE 23.0 - [id number] Intraocular Lens LENS IOL TECNIS EYHANCE 23.0 [id number] [employer]  Left 1 Implanted      Procedure(s) with comments: CATARACT EXTRACTION PHACO AND INTRAOCULAR LENS PLACEMENT (IOC) LEFT DIABETIC  6.37  01:00.6 (Left) - Diabetic  DIB00 +23.0   SURGEON:  Benay Pillow, MD, MPH   ANESTHESIA:  Topical with tetracaine drops augmented with 1% preservative-free intracameral lidocaine.  ESTIMATED BLOOD LOSS: <1 mL   COMPLICATIONS:  None.   DESCRIPTION OF PROCEDURE:  The patient was identified in the holding room and transported to the operating room and placed in the supine position under the operating microscope.  The left eye was identified as the operative eye and it was prepped and draped in the usual sterile ophthalmic fashion.   A 1.0 millimeter clear-corneal paracentesis was made at the 5:00 position. 0.5 ml of preservative-free 1% lidocaine with epinephrine was injected into the anterior chamber.  The anterior chamber was filled with viscoelastic.  A 2.4 millimeter keratome was used to make a near-clear corneal incision at the 2:00 position.  A curvilinear capsulorrhexis was made with a cystotome and capsulorrhexis forceps.  Balanced salt solution was used to hydrodissect and hydrodelineate the nucleus.   Phacoemulsification was then used in stop and chop fashion to remove the lens nucleus and epinucleus.  The remaining cortex was then removed using the irrigation and aspiration handpiece. Viscoelastic was then placed into the capsular bag to distend it for lens  placement.  A lens was then injected into the capsular bag.  The remaining viscoelastic was aspirated.   Wounds were hydrated with balanced salt solution.  The anterior chamber was inflated to a physiologic pressure with balanced salt solution.  Intracameral vigamox 0.1 mL undiltued was injected into the eye and a drop placed onto the ocular surface.  No wound leaks were noted.  The patient was taken to the recovery room in stable condition without complications of anesthesia or surgery  Benay Pillow 07/19/2022, 8:20 AM

## 2022-07-23 ENCOUNTER — Encounter: Payer: Self-pay | Admitting: Ophthalmology

## 2022-08-12 ENCOUNTER — Encounter: Payer: Self-pay | Admitting: Ophthalmology

## 2022-08-16 NOTE — Discharge Instructions (Signed)
   Cataract Surgery, Care After ? ?This sheet gives you information about how to care for yourself after your surgery.  Your ophthalmologist may also give you more specific instructions.  If you have problems or questions, contact your doctor at [facility], [phone]. ? ?What can I expect after the surgery? ?It is common to have: ?Itching ?Foreign body sensation (feels like a grain of sand in the eye) ?Watery discharge (excess tearing) ?Sensitivity to light and touch ?Bruising in or around the eye ?Mild blurred vision ? ?Follow these instructions at home: ?Do not touch or rub your eyes. ?You may be told to wear a protective shield or sunglasses to protect your eyes. ?Do not put a contact lens in the operative eye unless your doctor approves. ?Keep the lids and face clean and dry. ?Do not allow water to hit you directly in the face while showering. ?Keep soap and shampoo out of your eyes. ?Do not use eye makeup for 1 week. ? ?Check your eye every day for signs of infection.  Watch for: ?Redness, swelling, or pain. ?Fluid, blood or pus. ?Worsening vision. ?Worsening sensitivity to light or touch. ? ?Activity: ?During the first day, avoid bending over and reading.  You may resume reading and bending the next day. ?Do not drive or use heavy machinery for at least 24 hours. ?Avoid strenuous activities for 1 week.  Activities such as walking, treadmill, exercise bike, and climbing stairs are okay. ?Do not lift heavy (>20 pound) objects for 1 week. ?Do not do yardwork, gardening, or dirty housework (mopping, cleaning bathrooms, vacuuming, etc.) for 1 week. ?Do not swim or use a hot tub for 2 weeks. ?Ask your doctor when you can return to work. ? ?General Instructions: ?Take or apply prescription and over-the-counter medicines as directed by your doctor, including eyedrops and ointments. ?Resume medications discontinued prior to surgery, unless told otherwise by your doctor. ?Keep all follow up appointments as  scheduled. ? ?Contact a health care provider if: ?You have increased bruising around your eye. ?You have pain that is not helped with medication. ?You have a fever. ?You have fluid, pus, or blood coming from your eye or incision. ?Your sensitivity to light gets worse. ?You have spots (floaters) of flashing lights in your vision. ?You have nausea or vomiting. ? ?Go to the nearest emergency room or call 911 if: ?You have sudden loss of vision. ?You have severe, worsening eye pain. ? ?

## 2022-08-19 ENCOUNTER — Ambulatory Visit: Payer: Medicare PPO | Admitting: Anesthesiology

## 2022-08-19 ENCOUNTER — Ambulatory Visit
Admission: RE | Admit: 2022-08-19 | Discharge: 2022-08-19 | Disposition: A | Discharge status: Home / Self Care | Payer: Medicare PPO | Attending: Ophthalmology | Admitting: Ophthalmology

## 2022-08-19 ENCOUNTER — Encounter: Payer: Self-pay | Admitting: Ophthalmology

## 2022-08-19 ENCOUNTER — Other Ambulatory Visit: Payer: Self-pay

## 2022-08-19 ENCOUNTER — Encounter
Admission: RE | Disposition: A | Discharge status: Home / Self Care | Payer: Self-pay | Source: Home / Self Care | Attending: Ophthalmology

## 2022-08-19 DIAGNOSIS — Z8673 Personal history of transient ischemic attack (TIA), and cerebral infarction without residual deficits: Secondary | ICD-10-CM | POA: Insufficient documentation

## 2022-08-19 DIAGNOSIS — I10 Essential (primary) hypertension: Secondary | ICD-10-CM | POA: Insufficient documentation

## 2022-08-19 DIAGNOSIS — E109 Type 1 diabetes mellitus without complications: Secondary | ICD-10-CM

## 2022-08-19 DIAGNOSIS — Z794 Long term (current) use of insulin: Secondary | ICD-10-CM | POA: Diagnosis not present

## 2022-08-19 DIAGNOSIS — G4730 Sleep apnea, unspecified: Secondary | ICD-10-CM | POA: Insufficient documentation

## 2022-08-19 DIAGNOSIS — E1151 Type 2 diabetes mellitus with diabetic peripheral angiopathy without gangrene: Secondary | ICD-10-CM | POA: Diagnosis not present

## 2022-08-19 DIAGNOSIS — H59022 Cataract (lens) fragments in eye following cataract surgery, left eye: Secondary | ICD-10-CM | POA: Diagnosis not present

## 2022-08-19 DIAGNOSIS — I252 Old myocardial infarction: Secondary | ICD-10-CM | POA: Diagnosis not present

## 2022-08-19 DIAGNOSIS — I2510 Atherosclerotic heart disease of native coronary artery without angina pectoris: Secondary | ICD-10-CM | POA: Diagnosis not present

## 2022-08-19 HISTORY — PX: CATARACT EXTRACTION W/PHACO: SHX586

## 2022-08-19 LAB — GLUCOSE, CAPILLARY: Glucose-Capillary: 162 mg/dL — ABNORMAL HIGH (ref 70–99)

## 2022-08-19 SURGERY — PHACOEMULSIFICATION, CATARACT, WITH IOL INSERTION
Anesthesia: Monitor Anesthesia Care | Site: Eye | Laterality: Left

## 2022-08-19 MED ORDER — SIGHTPATH DOSE#1 BSS IO SOLN
INTRAOCULAR | Status: DC | PRN
  Administered 2022-08-19: 5 mL via OPHTHALMIC

## 2022-08-19 MED ORDER — SIGHTPATH DOSE#1 BSS IO SOLN
INTRAOCULAR | Status: DC | PRN
  Administered 2022-08-19: 15 mL

## 2022-08-19 MED ORDER — TETRACAINE HCL 0.5 % OP SOLN
1.0000 [drp] | OPHTHALMIC | Status: DC | PRN
  Administered 2022-08-19 (×3): 1 [drp] via OPHTHALMIC

## 2022-08-19 MED ORDER — SODIUM CHLORIDE 0.9% FLUSH
INTRAVENOUS | Status: DC | PRN
  Administered 2022-08-19: 3 mL via INTRAVENOUS

## 2022-08-19 MED ORDER — MIDAZOLAM HCL 2 MG/2ML IJ SOLN
INTRAMUSCULAR | Status: DC | PRN
  Administered 2022-08-19: 2 mg via INTRAVENOUS

## 2022-08-19 MED ORDER — MOXIFLOXACIN HCL 0.5 % OP SOLN
OPHTHALMIC | Status: DC | PRN
  Administered 2022-08-19: .2 mL via OPHTHALMIC

## 2022-08-19 MED ORDER — LIDOCAINE HCL (PF) 2 % IJ SOLN
INTRAOCULAR | Status: DC | PRN
  Administered 2022-08-19: 1 mL via INTRAOCULAR

## 2022-08-19 SURGICAL SUPPLY — 12 items
CATARACT SUITE SIGHTPATH (MISCELLANEOUS) ×1 IMPLANT
DISSECTOR HYDRO NUCLEUS 50X22 (MISCELLANEOUS) ×1 IMPLANT
FEE CATARACT SUITE SIGHTPATH (MISCELLANEOUS) ×1 IMPLANT
GLOVE SURG GAMMEX PI TX LF 7.5 (GLOVE) ×1 IMPLANT
GLOVE SURG SYN 8.5  E (GLOVE) ×1
GLOVE SURG SYN 8.5 E (GLOVE) ×1 IMPLANT
GLOVE SURG SYN 8.5 PF PI (GLOVE) ×1 IMPLANT
NDL FILTER BLUNT 18X1 1/2 (NEEDLE) ×1 IMPLANT
NEEDLE FILTER BLUNT 18X1 1/2 (NEEDLE) ×1 IMPLANT
SYR 3ML LL SCALE MARK (SYRINGE) ×1 IMPLANT
SYR 5ML LL (SYRINGE) ×1 IMPLANT
WATER STERILE IRR 250ML POUR (IV SOLUTION) ×1 IMPLANT

## 2022-08-19 NOTE — Transfer of Care (Signed)
Immediate Anesthesia Transfer of Care Note  Patient: Tyler Farley  Procedure(s) Performed: REMOVAL OF LENS FRAGMENTS LEFT  DIABETIC (Left: Eye)  Patient Location: PACU  Anesthesia Type: MAC  Level of Consciousness: awake, alert  and patient cooperative  Airway and Oxygen Therapy: Patient Spontanous Breathing and Patient connected to supplemental oxygen  Post-op Assessment: Post-op Vital signs reviewed, Patient's Cardiovascular Status Stable, Respiratory Function Stable, Patent Airway and No signs of Nausea or vomiting  Post-op Vital Signs: Reviewed and stable  Complications: No notable events documented.

## 2022-08-19 NOTE — Anesthesia Preprocedure Evaluation (Addendum)
Anesthesia Evaluation  Patient identified by MRN, date of birth, ID band Patient awake    Reviewed: Allergy & Precautions, NPO status , Patient's Chart, lab work & pertinent test results  History of Anesthesia Complications Negative for: history of anesthetic complications  Airway Mallampati: III   Neck ROM: Full    Dental no notable dental hx.    Pulmonary sleep apnea    Pulmonary exam normal breath sounds clear to auscultation       Cardiovascular hypertension, + CAD (s/p MI and stents) and + Peripheral Vascular Disease (s/p fem pop bypass)  Normal cardiovascular exam Rhythm:Regular Rate:Normal     Neuro/Psych CVA (2012, residual left-sided paralysis; uses cane for ambulation)    GI/Hepatic negative GI ROS,,,  Endo/Other  diabetes, Type 1    Renal/GU Renal disease (nephrolithiasis)   Prostate CA    Musculoskeletal   Abdominal   Peds  Hematology negative hematology ROS (+)   Anesthesia Other Findings   Reproductive/Obstetrics                             Anesthesia Physical Anesthesia Plan  ASA: 3  Anesthesia Plan: MAC   Post-op Pain Management:    Induction: Intravenous  PONV Risk Score and Plan: 1 and Treatment may vary due to age or medical condition, Midazolam and TIVA  Airway Management Planned: Natural Airway and Nasal Cannula  Additional Equipment:   Intra-op Plan:   Post-operative Plan:   Informed Consent: I have reviewed the patients History and Physical, chart, labs and discussed the procedure including the risks, benefits and alternatives for the proposed anesthesia with the patient or authorized representative who has indicated his/her understanding and acceptance.     Dental advisory given  Plan Discussed with: CRNA  Anesthesia Plan Comments: (LMA/GETA backup discussed.  Patient consented for risks of anesthesia including but not limited to:  - adverse  reactions to medications - damage to eyes, teeth, lips or other oral mucosa - nerve damage due to positioning  - sore throat or hoarseness - damage to heart, brain, nerves, lungs, other parts of body or loss of life  Informed patient about role of CRNA in peri- and intra-operative care.  Patient voiced understanding.)       Anesthesia Quick Evaluation

## 2022-08-19 NOTE — H&P (Signed)
[place]   Primary Care Physician:  [name], MD Ophthalmologist: Dr. Benay Pillow  Pre-Procedure History & Physical: HPI:  Tyler Farley is a 62 y.o. male here s/p cataract surgery with retained lens fragment in the anterior chamber.   Past Medical History:  Diagnosis Date   Coronary artery disease    Diabetes [facility])    ED (erectile dysfunction)    Glaucoma    History of colonic polyps    History of kidney stones    Hyperlipemia    Hypertension    Ischemic toe ulcer ([place])    Myocardial infarction ([place]) 2018   Peripheral vascular disease ([place])    Pneumonia    Prostate cancer ([place]) 2018   Retinopathy, background, proliferative    Sleep apnea    Stroke ([place]) 2018   left side weakness    Past Surgical History:  Procedure Laterality Date   BRONCHOSCOPY     CARDIAC CATHETERIZATION  2018   Stent   CATARACT EXTRACTION W/PHACO Right 06/17/2022   Procedure: CATARACT EXTRACTION PHACO AND INTRAOCULAR LENS PLACEMENT ([place]) RIGHT DIABETIC;  Surgeon: Eulogio Bear, MD;  Location: [place];  Service: Ophthalmology;  Laterality: Right;  3.31 0:35.1   CATARACT EXTRACTION W/PHACO Left 07/19/2022   Procedure: CATARACT EXTRACTION PHACO AND INTRAOCULAR LENS PLACEMENT (I[facility]) LEFT DIABETIC  6.37  01:00.6;  Surgeon: Eulogio Bear, MD;  Location: [place];  Service: Ophthalmology;  Laterality: Left;  Diabetic   COLONOSCOPY WITH PROPOFOL N/A 03/04/2018   Procedure: COLONOSCOPY WITH PROPOFOL;  Surgeon: Toledo, Benay Pike, MD;  Location: [facility];  Service: Gastroenterology;  Laterality: N/A;   EYE SURGERY     FEMORAL-POPLITEAL BYPASS GRAFT Bilateral 2013, 2014   PROSTATE BIOPSY  12/10/2016   PROSTATE SURGERY  03/05/2022   [facility]   RADIOACTIVE SEED IMPLANT N/A 08/22/2017   Procedure: RADIOACTIVE SEED IMPLANT/BRACHYTHERAPY IMPLANT;  Surgeon: Kathie Rhodes, MD;  [facility];  Service: Urology;  Laterality: N/A;   REFRACTIVE SURGERY     Right  2017, Left 2007   SPACE OAR INSTILLATION N/A 08/22/2017   Procedure: SPACE OAR INSTILLATION;  Surgeon: Kathie Rhodes, MD;  [facility];  Service: Urology;  Laterality: N/A;    Prior to Admission medications   Medication Sig Start Date End Date Taking? Authorizing Provider  Alpha-Lipoic Acid 200 MG CAPS Take 400 mg by mouth 2 (two) times daily.    Yes [provider]  Amino Acids (L-CARNITINE PO) Take 800 mg by mouth 2 (two) times daily.   Yes [provider]  aspirin 81 MG tablet Take 81 mg by mouth daily.  11/20/10  Yes [provider]  atorvastatin (LIPITOR) 20 MG tablet Take 1 tablet by mouth at bedtime. 03/15/21  Yes [provider]  B Complex Vitamins (VITAMIN-B COMPLEX) TABS Take 1 tablet by mouth daily.    Yes [provider]  baclofen (LIORESAL) 10 MG tablet Take 10 mg by mouth at bedtime.  01/05/16  Yes [provider]  carvedilol (COREG) 25 MG tablet Take 25 mg by mouth 2 (two) times daily with a meal.  01/05/16  Yes [provider]  Cholecalciferol 50 MCG (2000 UT) TABS Take by mouth.   Yes [provider]  Coenzyme Q10 100 MG capsule Take 100 mg by mouth daily.  01/31/16  Yes [provider]  ezetimibe (ZETIA) 10 MG tablet Take 10 mg by mouth daily.   Yes [provider]  hydrochlorothiazide (HYDRODIURIL) 12.5  MG tablet Take 12.5 mg by mouth daily.  02/19/16  Yes [provider]  LANTUS SOLOSTAR 100 UNIT/ML Solostar Pen Inject 20 Units into the skin at bedtime.  01/31/16  Yes [provider]  losartan (COZAAR) 100 MG tablet Take 1 tablet by mouth daily. 09/24/21  Yes [provider]  nitroGLYCERIN (NITROSTAT) 0.4 MG SL tablet Place 0.4 mg under the tongue every 5 (five) minutes as needed for chest pain.   Yes [provider]  NOVOLOG FLEXPEN 100 UNIT/ML FlexPen Inject 5-12 Units into the skin 3 (three) times daily with meals. Per sliding scale 01/28/16  Yes [provider]    Allergies as of 08/12/2022 - Review Complete 08/12/2022  Allergen Reaction Noted   Atorvastatin Other (See Comments) 02/14/2013    Family History  Problem Relation Age of Onset   Prostate cancer Maternal Uncle    Kidney cancer Neg Hx    Bladder Cancer Neg Hx     Social History   Socioeconomic History   Marital status: Married    Spouse name: Not on file   Number of children: Not on file   Years of education: Not on file   Highest education level: Not on file  Occupational History   Not on file  Tobacco Use   Smoking status: Never   Smokeless tobacco: Never  Vaping Use   Vaping Use: Never used  Substance and Sexual Activity   Alcohol use: No   Drug use: No   Sexual activity: Never  Other Topics Concern   Not on file  Social History Narrative   Not on file   Social Determinants of Health   Financial Resource Strain: Not on file  Food Insecurity: Not on file  Transportation Needs: Not on file  Physical Activity: Not on file  Stress: Not on file  Social Connections: Not on file  Intimate Partner Violence: Not on file    Review of Systems: See HPI, otherwise negative ROS  Physical Exam: BP 122/62   Pulse 64   Temp (!) 97.3 F (36.3 C) (Temporal)   Resp 18   Ht '5\' 6"'$  (1.676 m)   Wt 68 kg   SpO2 96%   BMI 24.20 kg/m  General:   Alert, cooperative in NAD Head:  Normocephalic and atraumatic. Respiratory:  Normal work of breathing. Cardiovascular:  RRR  Impression/Plan: Tyler Farley is here for lens fragment removal.  Risks, benefits, limitations, and alternatives regarding cataract surgery have been reviewed with the patient.  Questions have been answered.  All parties agreeable.   Benay Pillow, MD  08/19/2022, 7:59 AM

## 2022-08-19 NOTE — Op Note (Signed)
OPERATIVE NOTE  Tyler Farley [id number] 08/19/2022   PREOPERATIVE DIAGNOSIS:  retained lens fragment following cataract surgery, left eye H59.022   POSTOPERATIVE DIAGNOSIS:    same   PROCEDURE:  CPT [id number] removal of retained lens fragment  LENS:  * No implants in log *    Procedure(s) with comments: REMOVAL OF LENS FRAGMENTS LEFT  DIABETIC (Left) - Diabetic    ULTRASOUND TIME: 0 minutes 0 seconds.  CDE 0 Irrigation/aspiration only.   SURGEON:  Benay Pillow, MD, MPH   ANESTHESIA:  Topical with tetracaine drops augmented with 1% preservative-free intracameral lidocaine.  ESTIMATED BLOOD LOSS: <1 mL   COMPLICATIONS:  None.   DESCRIPTION OF PROCEDURE:  The patient was identified in the holding room and transported to the operating room and placed in the supine position under the operating microscope.  The left eye was identified as the operative eye and it was prepped and draped in the usual sterile ophthalmic fashion.   The existing wounds were reopened using the a blunt cannula.  There was a small corneal schisis from the paracentesis wound which did created some corneal edema that extended to the central axis, but this did not significantly inhibit visualization for the case.  A single fragment, about 1.5 mm x 1.5 mm x 1.0 mm was removed using the irrigation and aspiration handpiece.  It did require mashing with the second instrument.     Wounds were hydrated with balanced salt solution.  The anterior chamber was inflated to a physiologic pressure with balanced salt solution.  Intracameral vigamox 0.1 mL undiltued was injected into the eye and a drop placed onto the ocular surface.  No wound leaks were noted.  The patient was taken to the recovery room in stable condition without complications of anesthesia or surgery  Benay Pillow 08/19/2022, 8:22 AM

## 2022-08-19 NOTE — Anesthesia Postprocedure Evaluation (Signed)
Anesthesia Post Note  Patient: Tyler Farley  Procedure(s) Performed: REMOVAL OF LENS FRAGMENTS LEFT  DIABETIC (Left: Eye)  Patient location during evaluation: PACU Anesthesia Type: MAC Level of consciousness: awake and alert, oriented and patient cooperative Pain management: pain level controlled Vital Signs Assessment: post-procedure vital signs reviewed and stable Respiratory status: spontaneous breathing, nonlabored ventilation and respiratory function stable Cardiovascular status: blood pressure returned to baseline and stable Postop Assessment: adequate PO intake Anesthetic complications: no   No notable events documented.   Last Vitals:  Vitals:   08/19/22 0823 08/19/22 0827  BP: (!) 100/59 115/61  Pulse: 65 66  Resp: 14 14  Temp: (!) 36.3 C (!) 36.3 C  SpO2: 96% 97%    Last Pain:  Vitals:   08/19/22 0827  TempSrc:   PainSc: 0-No pain                 Darrin Nipper

## 2022-08-21 ENCOUNTER — Encounter: Payer: Self-pay | Admitting: Ophthalmology

## 2023-10-13 ENCOUNTER — Telehealth: Payer: Self-pay | Admitting: Radiation Oncology

## 2023-10-13 NOTE — Telephone Encounter (Signed)
 Received medical record request from [facility]. Sent records and forwarded request to [employer].
